# Patient Record
Sex: Male | Born: 1945
Health system: Southern US, Community
[De-identification: ages and names within clinical notes are randomized; demographics above are authoritative.]

## PROBLEM LIST (undated history)

## (undated) DIAGNOSIS — G4730 Sleep apnea, unspecified: Secondary | ICD-10-CM

## (undated) DIAGNOSIS — I219 Acute myocardial infarction, unspecified: Secondary | ICD-10-CM

## (undated) DIAGNOSIS — N390 Urinary tract infection, site not specified: Secondary | ICD-10-CM

## (undated) DIAGNOSIS — M160 Bilateral primary osteoarthritis of hip: Secondary | ICD-10-CM

## (undated) DIAGNOSIS — E785 Hyperlipidemia, unspecified: Secondary | ICD-10-CM

## (undated) DIAGNOSIS — C44319 Basal cell carcinoma of skin of other parts of face: Secondary | ICD-10-CM

## (undated) DIAGNOSIS — E119 Type 2 diabetes mellitus without complications: Secondary | ICD-10-CM

## (undated) DIAGNOSIS — M4310 Spondylolisthesis, site unspecified: Secondary | ICD-10-CM

## (undated) DIAGNOSIS — N503 Cyst of epididymis: Secondary | ICD-10-CM

## (undated) DIAGNOSIS — M1990 Unspecified osteoarthritis, unspecified site: Secondary | ICD-10-CM

## (undated) DIAGNOSIS — N433 Hydrocele, unspecified: Secondary | ICD-10-CM

## (undated) DIAGNOSIS — I10 Essential (primary) hypertension: Secondary | ICD-10-CM

## (undated) DIAGNOSIS — M5030 Other cervical disc degeneration, unspecified cervical region: Secondary | ICD-10-CM

## (undated) DIAGNOSIS — Z8489 Family history of other specified conditions: Secondary | ICD-10-CM

## (undated) DIAGNOSIS — E1169 Type 2 diabetes mellitus with other specified complication: Secondary | ICD-10-CM

## (undated) DIAGNOSIS — Z9889 Other specified postprocedural states: Secondary | ICD-10-CM

## (undated) DIAGNOSIS — R011 Cardiac murmur, unspecified: Secondary | ICD-10-CM

## (undated) DIAGNOSIS — C155 Malignant neoplasm of lower third of esophagus: Secondary | ICD-10-CM

## (undated) DIAGNOSIS — D649 Anemia, unspecified: Secondary | ICD-10-CM

## (undated) DIAGNOSIS — J90 Pleural effusion, not elsewhere classified: Secondary | ICD-10-CM

## (undated) DIAGNOSIS — M48061 Spinal stenosis, lumbar region without neurogenic claudication: Secondary | ICD-10-CM

## (undated) DIAGNOSIS — M5416 Radiculopathy, lumbar region: Secondary | ICD-10-CM

## (undated) DIAGNOSIS — H259 Unspecified age-related cataract: Secondary | ICD-10-CM

## (undated) DIAGNOSIS — C801 Malignant (primary) neoplasm, unspecified: Secondary | ICD-10-CM

## (undated) DIAGNOSIS — E559 Vitamin D deficiency, unspecified: Secondary | ICD-10-CM

## (undated) DIAGNOSIS — D500 Iron deficiency anemia secondary to blood loss (chronic): Secondary | ICD-10-CM

## (undated) DIAGNOSIS — R079 Chest pain, unspecified: Secondary | ICD-10-CM

## (undated) DIAGNOSIS — M4306 Spondylolysis, lumbar region: Secondary | ICD-10-CM

## (undated) DIAGNOSIS — R339 Retention of urine, unspecified: Secondary | ICD-10-CM

## (undated) HISTORY — DX: Vitamin D deficiency, unspecified: E55.9

## (undated) HISTORY — DX: Anemia, unspecified: D64.9

## (undated) HISTORY — DX: Spondylolisthesis, site unspecified: M43.10

## (undated) HISTORY — DX: Urinary tract infection, site not specified: N39.0

## (undated) HISTORY — PX: ANTERIOR CRUCIATE LIGAMENT REPAIR: SHX115

## (undated) HISTORY — DX: Essential (primary) hypertension: I10

## (undated) HISTORY — DX: Unspecified age-related cataract: H25.9

## (undated) HISTORY — DX: Cyst of epididymis: N50.3

## (undated) HISTORY — DX: Bilateral primary osteoarthritis of hip: M16.0

## (undated) HISTORY — DX: Spinal stenosis, lumbar region without neurogenic claudication: M48.061

## (undated) HISTORY — PX: APPENDECTOMY: SHX54

## (undated) HISTORY — DX: Other specified postprocedural states: Z98.890

## (undated) HISTORY — DX: Spondylolysis, lumbar region: M43.06

## (undated) HISTORY — DX: Pleural effusion, not elsewhere classified: J90

## (undated) HISTORY — DX: Malignant neoplasm of lower third of esophagus: C15.5

## (undated) HISTORY — DX: Other cervical disc degeneration, unspecified cervical region: M50.30

## (undated) HISTORY — DX: Hyperlipidemia, unspecified: E11.69

## (undated) HISTORY — DX: Hydrocele, unspecified: N43.3

## (undated) HISTORY — DX: Chest pain, unspecified: R07.9

## (undated) HISTORY — DX: Iron deficiency anemia secondary to blood loss (chronic): D50.0

## (undated) HISTORY — DX: Basal cell carcinoma of skin of other parts of face: C44.319

## (undated) HISTORY — DX: Retention of urine, unspecified: R33.9

## (undated) HISTORY — DX: Hyperlipidemia, unspecified: E78.5

## (undated) HISTORY — PX: HERNIA REPAIR: SHX51

## (undated) HISTORY — DX: Radiculopathy, lumbar region: M54.16

---

## 2014-04-26 DIAGNOSIS — G5602 Carpal tunnel syndrome, left upper limb: Secondary | ICD-10-CM | POA: Diagnosis not present

## 2014-04-26 DIAGNOSIS — G5601 Carpal tunnel syndrome, right upper limb: Secondary | ICD-10-CM | POA: Diagnosis not present

## 2014-04-26 DIAGNOSIS — E1140 Type 2 diabetes mellitus with diabetic neuropathy, unspecified: Secondary | ICD-10-CM | POA: Diagnosis not present

## 2014-04-26 DIAGNOSIS — G4733 Obstructive sleep apnea (adult) (pediatric): Secondary | ICD-10-CM | POA: Diagnosis not present

## 2014-04-26 DIAGNOSIS — E780 Pure hypercholesterolemia: Secondary | ICD-10-CM | POA: Diagnosis not present

## 2014-04-26 DIAGNOSIS — E1142 Type 2 diabetes mellitus with diabetic polyneuropathy: Secondary | ICD-10-CM | POA: Diagnosis not present

## 2014-04-26 DIAGNOSIS — I10 Essential (primary) hypertension: Secondary | ICD-10-CM | POA: Diagnosis not present

## 2014-04-28 DIAGNOSIS — E119 Type 2 diabetes mellitus without complications: Secondary | ICD-10-CM | POA: Diagnosis not present

## 2014-04-28 DIAGNOSIS — H04123 Dry eye syndrome of bilateral lacrimal glands: Secondary | ICD-10-CM | POA: Diagnosis not present

## 2014-05-19 DIAGNOSIS — J019 Acute sinusitis, unspecified: Secondary | ICD-10-CM | POA: Diagnosis not present

## 2014-05-23 DIAGNOSIS — E119 Type 2 diabetes mellitus without complications: Secondary | ICD-10-CM | POA: Diagnosis not present

## 2014-05-30 DIAGNOSIS — R3911 Hesitancy of micturition: Secondary | ICD-10-CM | POA: Diagnosis not present

## 2014-05-30 DIAGNOSIS — R3915 Urgency of urination: Secondary | ICD-10-CM | POA: Diagnosis not present

## 2014-06-02 DIAGNOSIS — R5383 Other fatigue: Secondary | ICD-10-CM | POA: Diagnosis not present

## 2014-06-02 DIAGNOSIS — G4733 Obstructive sleep apnea (adult) (pediatric): Secondary | ICD-10-CM | POA: Diagnosis not present

## 2014-06-02 DIAGNOSIS — J309 Allergic rhinitis, unspecified: Secondary | ICD-10-CM | POA: Diagnosis not present

## 2014-08-01 DIAGNOSIS — N400 Enlarged prostate without lower urinary tract symptoms: Secondary | ICD-10-CM | POA: Diagnosis not present

## 2014-08-01 DIAGNOSIS — G4733 Obstructive sleep apnea (adult) (pediatric): Secondary | ICD-10-CM | POA: Diagnosis not present

## 2014-08-01 DIAGNOSIS — E780 Pure hypercholesterolemia: Secondary | ICD-10-CM | POA: Diagnosis not present

## 2014-08-01 DIAGNOSIS — E119 Type 2 diabetes mellitus without complications: Secondary | ICD-10-CM | POA: Diagnosis not present

## 2014-08-01 DIAGNOSIS — E1142 Type 2 diabetes mellitus with diabetic polyneuropathy: Secondary | ICD-10-CM | POA: Diagnosis not present

## 2014-08-01 DIAGNOSIS — I10 Essential (primary) hypertension: Secondary | ICD-10-CM | POA: Diagnosis not present

## 2014-08-01 DIAGNOSIS — G5601 Carpal tunnel syndrome, right upper limb: Secondary | ICD-10-CM | POA: Diagnosis not present

## 2014-08-01 DIAGNOSIS — G5602 Carpal tunnel syndrome, left upper limb: Secondary | ICD-10-CM | POA: Diagnosis not present

## 2014-09-06 DIAGNOSIS — G4733 Obstructive sleep apnea (adult) (pediatric): Secondary | ICD-10-CM | POA: Diagnosis not present

## 2014-10-06 DIAGNOSIS — G4733 Obstructive sleep apnea (adult) (pediatric): Secondary | ICD-10-CM | POA: Diagnosis not present

## 2014-10-09 DIAGNOSIS — G4733 Obstructive sleep apnea (adult) (pediatric): Secondary | ICD-10-CM | POA: Diagnosis not present

## 2014-10-20 DIAGNOSIS — G4733 Obstructive sleep apnea (adult) (pediatric): Secondary | ICD-10-CM | POA: Diagnosis not present

## 2014-11-02 DIAGNOSIS — G4733 Obstructive sleep apnea (adult) (pediatric): Secondary | ICD-10-CM | POA: Diagnosis not present

## 2014-11-02 DIAGNOSIS — Z23 Encounter for immunization: Secondary | ICD-10-CM | POA: Diagnosis not present

## 2014-11-02 DIAGNOSIS — E1142 Type 2 diabetes mellitus with diabetic polyneuropathy: Secondary | ICD-10-CM | POA: Diagnosis not present

## 2014-11-02 DIAGNOSIS — N400 Enlarged prostate without lower urinary tract symptoms: Secondary | ICD-10-CM | POA: Diagnosis not present

## 2014-11-02 DIAGNOSIS — G5601 Carpal tunnel syndrome, right upper limb: Secondary | ICD-10-CM | POA: Diagnosis not present

## 2014-11-02 DIAGNOSIS — G5602 Carpal tunnel syndrome, left upper limb: Secondary | ICD-10-CM | POA: Diagnosis not present

## 2014-11-02 DIAGNOSIS — I10 Essential (primary) hypertension: Secondary | ICD-10-CM | POA: Diagnosis not present

## 2014-11-02 DIAGNOSIS — E780 Pure hypercholesterolemia: Secondary | ICD-10-CM | POA: Diagnosis not present

## 2014-11-06 DIAGNOSIS — G4733 Obstructive sleep apnea (adult) (pediatric): Secondary | ICD-10-CM | POA: Diagnosis not present

## 2014-11-10 DIAGNOSIS — G4733 Obstructive sleep apnea (adult) (pediatric): Secondary | ICD-10-CM | POA: Diagnosis not present

## 2014-11-10 DIAGNOSIS — R5383 Other fatigue: Secondary | ICD-10-CM | POA: Diagnosis not present

## 2014-11-10 DIAGNOSIS — J309 Allergic rhinitis, unspecified: Secondary | ICD-10-CM | POA: Diagnosis not present

## 2014-12-07 DIAGNOSIS — G4733 Obstructive sleep apnea (adult) (pediatric): Secondary | ICD-10-CM | POA: Diagnosis not present

## 2014-12-20 DIAGNOSIS — L570 Actinic keratosis: Secondary | ICD-10-CM | POA: Diagnosis not present

## 2014-12-20 DIAGNOSIS — C44319 Basal cell carcinoma of skin of other parts of face: Secondary | ICD-10-CM | POA: Diagnosis not present

## 2014-12-26 DIAGNOSIS — G4733 Obstructive sleep apnea (adult) (pediatric): Secondary | ICD-10-CM | POA: Diagnosis not present

## 2015-01-06 DIAGNOSIS — G4733 Obstructive sleep apnea (adult) (pediatric): Secondary | ICD-10-CM | POA: Diagnosis not present

## 2015-01-17 DIAGNOSIS — C44319 Basal cell carcinoma of skin of other parts of face: Secondary | ICD-10-CM | POA: Diagnosis not present

## 2015-02-06 DIAGNOSIS — G4733 Obstructive sleep apnea (adult) (pediatric): Secondary | ICD-10-CM | POA: Diagnosis not present

## 2015-02-07 DIAGNOSIS — E782 Mixed hyperlipidemia: Secondary | ICD-10-CM | POA: Diagnosis not present

## 2015-02-07 DIAGNOSIS — G5601 Carpal tunnel syndrome, right upper limb: Secondary | ICD-10-CM | POA: Diagnosis not present

## 2015-02-07 DIAGNOSIS — I10 Essential (primary) hypertension: Secondary | ICD-10-CM | POA: Diagnosis not present

## 2015-02-07 DIAGNOSIS — E1142 Type 2 diabetes mellitus with diabetic polyneuropathy: Secondary | ICD-10-CM | POA: Diagnosis not present

## 2015-02-07 DIAGNOSIS — G4733 Obstructive sleep apnea (adult) (pediatric): Secondary | ICD-10-CM | POA: Diagnosis not present

## 2015-02-07 DIAGNOSIS — N400 Enlarged prostate without lower urinary tract symptoms: Secondary | ICD-10-CM | POA: Diagnosis not present

## 2015-02-07 DIAGNOSIS — I498 Other specified cardiac arrhythmias: Secondary | ICD-10-CM | POA: Diagnosis not present

## 2015-02-07 DIAGNOSIS — E781 Pure hyperglyceridemia: Secondary | ICD-10-CM | POA: Diagnosis not present

## 2015-02-07 DIAGNOSIS — G5602 Carpal tunnel syndrome, left upper limb: Secondary | ICD-10-CM | POA: Diagnosis not present

## 2015-02-07 DIAGNOSIS — E119 Type 2 diabetes mellitus without complications: Secondary | ICD-10-CM | POA: Diagnosis not present

## 2015-02-16 DIAGNOSIS — J309 Allergic rhinitis, unspecified: Secondary | ICD-10-CM | POA: Diagnosis not present

## 2015-02-16 DIAGNOSIS — G4733 Obstructive sleep apnea (adult) (pediatric): Secondary | ICD-10-CM | POA: Diagnosis not present

## 2015-02-16 DIAGNOSIS — R5383 Other fatigue: Secondary | ICD-10-CM | POA: Diagnosis not present

## 2015-02-21 DIAGNOSIS — I10 Essential (primary) hypertension: Secondary | ICD-10-CM | POA: Diagnosis not present

## 2015-03-08 DIAGNOSIS — G4733 Obstructive sleep apnea (adult) (pediatric): Secondary | ICD-10-CM | POA: Diagnosis not present

## 2015-03-15 DIAGNOSIS — G4733 Obstructive sleep apnea (adult) (pediatric): Secondary | ICD-10-CM | POA: Diagnosis not present

## 2015-03-21 DIAGNOSIS — L570 Actinic keratosis: Secondary | ICD-10-CM | POA: Diagnosis not present

## 2015-03-22 DIAGNOSIS — J301 Allergic rhinitis due to pollen: Secondary | ICD-10-CM | POA: Diagnosis not present

## 2015-03-22 DIAGNOSIS — G4733 Obstructive sleep apnea (adult) (pediatric): Secondary | ICD-10-CM | POA: Diagnosis not present

## 2015-03-22 DIAGNOSIS — R5383 Other fatigue: Secondary | ICD-10-CM | POA: Diagnosis not present

## 2015-03-30 DIAGNOSIS — G4733 Obstructive sleep apnea (adult) (pediatric): Secondary | ICD-10-CM | POA: Diagnosis not present

## 2015-04-08 DIAGNOSIS — G4733 Obstructive sleep apnea (adult) (pediatric): Secondary | ICD-10-CM | POA: Diagnosis not present

## 2015-05-09 DIAGNOSIS — G4733 Obstructive sleep apnea (adult) (pediatric): Secondary | ICD-10-CM | POA: Diagnosis not present

## 2015-05-15 DIAGNOSIS — G5601 Carpal tunnel syndrome, right upper limb: Secondary | ICD-10-CM | POA: Diagnosis not present

## 2015-05-15 DIAGNOSIS — G4733 Obstructive sleep apnea (adult) (pediatric): Secondary | ICD-10-CM | POA: Diagnosis not present

## 2015-05-15 DIAGNOSIS — N400 Enlarged prostate without lower urinary tract symptoms: Secondary | ICD-10-CM | POA: Diagnosis not present

## 2015-05-15 DIAGNOSIS — I10 Essential (primary) hypertension: Secondary | ICD-10-CM | POA: Diagnosis not present

## 2015-05-15 DIAGNOSIS — E119 Type 2 diabetes mellitus without complications: Secondary | ICD-10-CM | POA: Diagnosis not present

## 2015-05-15 DIAGNOSIS — E1142 Type 2 diabetes mellitus with diabetic polyneuropathy: Secondary | ICD-10-CM | POA: Diagnosis not present

## 2015-05-15 DIAGNOSIS — E782 Mixed hyperlipidemia: Secondary | ICD-10-CM | POA: Diagnosis not present

## 2015-05-15 DIAGNOSIS — Z23 Encounter for immunization: Secondary | ICD-10-CM | POA: Diagnosis not present

## 2015-05-15 DIAGNOSIS — G5602 Carpal tunnel syndrome, left upper limb: Secondary | ICD-10-CM | POA: Diagnosis not present

## 2015-05-30 DIAGNOSIS — L570 Actinic keratosis: Secondary | ICD-10-CM | POA: Diagnosis not present

## 2015-06-06 DIAGNOSIS — G4733 Obstructive sleep apnea (adult) (pediatric): Secondary | ICD-10-CM | POA: Diagnosis not present

## 2015-07-07 DIAGNOSIS — G4733 Obstructive sleep apnea (adult) (pediatric): Secondary | ICD-10-CM | POA: Diagnosis not present

## 2015-07-09 DIAGNOSIS — G4733 Obstructive sleep apnea (adult) (pediatric): Secondary | ICD-10-CM | POA: Diagnosis not present

## 2015-07-13 DIAGNOSIS — H524 Presbyopia: Secondary | ICD-10-CM | POA: Diagnosis not present

## 2015-07-13 DIAGNOSIS — H5210 Myopia, unspecified eye: Secondary | ICD-10-CM | POA: Diagnosis not present

## 2015-07-19 DIAGNOSIS — Z6831 Body mass index (BMI) 31.0-31.9, adult: Secondary | ICD-10-CM | POA: Diagnosis not present

## 2015-07-19 DIAGNOSIS — M160 Bilateral primary osteoarthritis of hip: Secondary | ICD-10-CM | POA: Diagnosis not present

## 2015-07-19 DIAGNOSIS — Z1389 Encounter for screening for other disorder: Secondary | ICD-10-CM | POA: Diagnosis not present

## 2015-07-19 DIAGNOSIS — I10 Essential (primary) hypertension: Secondary | ICD-10-CM | POA: Diagnosis not present

## 2015-07-19 DIAGNOSIS — Z139 Encounter for screening, unspecified: Secondary | ICD-10-CM | POA: Diagnosis not present

## 2015-07-19 DIAGNOSIS — M5136 Other intervertebral disc degeneration, lumbar region: Secondary | ICD-10-CM | POA: Diagnosis not present

## 2015-07-19 DIAGNOSIS — M4306 Spondylolysis, lumbar region: Secondary | ICD-10-CM | POA: Diagnosis not present

## 2015-07-19 DIAGNOSIS — M25552 Pain in left hip: Secondary | ICD-10-CM | POA: Diagnosis not present

## 2015-07-19 DIAGNOSIS — Z9181 History of falling: Secondary | ICD-10-CM | POA: Diagnosis not present

## 2015-07-19 DIAGNOSIS — M5416 Radiculopathy, lumbar region: Secondary | ICD-10-CM | POA: Diagnosis not present

## 2015-08-06 DIAGNOSIS — G4733 Obstructive sleep apnea (adult) (pediatric): Secondary | ICD-10-CM | POA: Diagnosis not present

## 2015-09-06 DIAGNOSIS — G4733 Obstructive sleep apnea (adult) (pediatric): Secondary | ICD-10-CM | POA: Diagnosis not present

## 2015-09-18 DIAGNOSIS — M25562 Pain in left knee: Secondary | ICD-10-CM | POA: Diagnosis not present

## 2015-09-18 DIAGNOSIS — R262 Difficulty in walking, not elsewhere classified: Secondary | ICD-10-CM | POA: Diagnosis not present

## 2015-09-18 DIAGNOSIS — M545 Low back pain: Secondary | ICD-10-CM | POA: Diagnosis not present

## 2015-09-21 DIAGNOSIS — R262 Difficulty in walking, not elsewhere classified: Secondary | ICD-10-CM | POA: Diagnosis not present

## 2015-09-21 DIAGNOSIS — M25562 Pain in left knee: Secondary | ICD-10-CM | POA: Diagnosis not present

## 2015-09-21 DIAGNOSIS — M545 Low back pain: Secondary | ICD-10-CM | POA: Diagnosis not present

## 2015-09-24 DIAGNOSIS — M545 Low back pain: Secondary | ICD-10-CM | POA: Diagnosis not present

## 2015-09-24 DIAGNOSIS — R262 Difficulty in walking, not elsewhere classified: Secondary | ICD-10-CM | POA: Diagnosis not present

## 2015-09-24 DIAGNOSIS — M25562 Pain in left knee: Secondary | ICD-10-CM | POA: Diagnosis not present

## 2015-09-28 DIAGNOSIS — M545 Low back pain: Secondary | ICD-10-CM | POA: Diagnosis not present

## 2015-09-28 DIAGNOSIS — M25562 Pain in left knee: Secondary | ICD-10-CM | POA: Diagnosis not present

## 2015-09-28 DIAGNOSIS — R262 Difficulty in walking, not elsewhere classified: Secondary | ICD-10-CM | POA: Diagnosis not present

## 2015-10-03 DIAGNOSIS — R262 Difficulty in walking, not elsewhere classified: Secondary | ICD-10-CM | POA: Diagnosis not present

## 2015-10-03 DIAGNOSIS — M25562 Pain in left knee: Secondary | ICD-10-CM | POA: Diagnosis not present

## 2015-10-03 DIAGNOSIS — M545 Low back pain: Secondary | ICD-10-CM | POA: Diagnosis not present

## 2015-10-10 DIAGNOSIS — M545 Low back pain: Secondary | ICD-10-CM | POA: Diagnosis not present

## 2015-10-10 DIAGNOSIS — R262 Difficulty in walking, not elsewhere classified: Secondary | ICD-10-CM | POA: Diagnosis not present

## 2015-10-10 DIAGNOSIS — M25562 Pain in left knee: Secondary | ICD-10-CM | POA: Diagnosis not present

## 2015-10-10 DIAGNOSIS — G4733 Obstructive sleep apnea (adult) (pediatric): Secondary | ICD-10-CM | POA: Diagnosis not present

## 2015-10-12 DIAGNOSIS — M545 Low back pain: Secondary | ICD-10-CM | POA: Diagnosis not present

## 2015-10-12 DIAGNOSIS — M25562 Pain in left knee: Secondary | ICD-10-CM | POA: Diagnosis not present

## 2015-10-12 DIAGNOSIS — R262 Difficulty in walking, not elsewhere classified: Secondary | ICD-10-CM | POA: Diagnosis not present

## 2015-10-23 DIAGNOSIS — M25562 Pain in left knee: Secondary | ICD-10-CM | POA: Diagnosis not present

## 2015-10-23 DIAGNOSIS — M545 Low back pain: Secondary | ICD-10-CM | POA: Diagnosis not present

## 2015-10-23 DIAGNOSIS — R262 Difficulty in walking, not elsewhere classified: Secondary | ICD-10-CM | POA: Diagnosis not present

## 2015-10-26 DIAGNOSIS — R262 Difficulty in walking, not elsewhere classified: Secondary | ICD-10-CM | POA: Diagnosis not present

## 2015-10-26 DIAGNOSIS — M25562 Pain in left knee: Secondary | ICD-10-CM | POA: Diagnosis not present

## 2015-10-26 DIAGNOSIS — M545 Low back pain: Secondary | ICD-10-CM | POA: Diagnosis not present

## 2015-10-31 DIAGNOSIS — M545 Low back pain: Secondary | ICD-10-CM | POA: Diagnosis not present

## 2015-10-31 DIAGNOSIS — R262 Difficulty in walking, not elsewhere classified: Secondary | ICD-10-CM | POA: Diagnosis not present

## 2015-10-31 DIAGNOSIS — M25562 Pain in left knee: Secondary | ICD-10-CM | POA: Diagnosis not present

## 2015-11-07 DIAGNOSIS — M545 Low back pain: Secondary | ICD-10-CM | POA: Diagnosis not present

## 2015-11-07 DIAGNOSIS — M25562 Pain in left knee: Secondary | ICD-10-CM | POA: Diagnosis not present

## 2015-11-07 DIAGNOSIS — R262 Difficulty in walking, not elsewhere classified: Secondary | ICD-10-CM | POA: Diagnosis not present

## 2015-11-15 DIAGNOSIS — R262 Difficulty in walking, not elsewhere classified: Secondary | ICD-10-CM | POA: Diagnosis not present

## 2015-11-15 DIAGNOSIS — M25562 Pain in left knee: Secondary | ICD-10-CM | POA: Diagnosis not present

## 2015-11-15 DIAGNOSIS — M545 Low back pain: Secondary | ICD-10-CM | POA: Diagnosis not present

## 2015-11-22 DIAGNOSIS — L570 Actinic keratosis: Secondary | ICD-10-CM | POA: Diagnosis not present

## 2016-01-18 DIAGNOSIS — G4733 Obstructive sleep apnea (adult) (pediatric): Secondary | ICD-10-CM | POA: Diagnosis not present

## 2016-01-24 DIAGNOSIS — I10 Essential (primary) hypertension: Secondary | ICD-10-CM | POA: Diagnosis not present

## 2016-01-24 DIAGNOSIS — Z0000 Encounter for general adult medical examination without abnormal findings: Secondary | ICD-10-CM | POA: Diagnosis not present

## 2016-01-24 DIAGNOSIS — E669 Obesity, unspecified: Secondary | ICD-10-CM | POA: Diagnosis not present

## 2016-01-24 DIAGNOSIS — E785 Hyperlipidemia, unspecified: Secondary | ICD-10-CM | POA: Diagnosis not present

## 2016-01-24 DIAGNOSIS — Z6831 Body mass index (BMI) 31.0-31.9, adult: Secondary | ICD-10-CM | POA: Diagnosis not present

## 2016-01-24 DIAGNOSIS — M4317 Spondylolisthesis, lumbosacral region: Secondary | ICD-10-CM | POA: Diagnosis not present

## 2016-01-24 DIAGNOSIS — E1165 Type 2 diabetes mellitus with hyperglycemia: Secondary | ICD-10-CM | POA: Diagnosis not present

## 2016-01-24 DIAGNOSIS — L989 Disorder of the skin and subcutaneous tissue, unspecified: Secondary | ICD-10-CM | POA: Diagnosis not present

## 2016-03-13 DIAGNOSIS — S61412A Laceration without foreign body of left hand, initial encounter: Secondary | ICD-10-CM | POA: Diagnosis not present

## 2016-03-19 DIAGNOSIS — C44319 Basal cell carcinoma of skin of other parts of face: Secondary | ICD-10-CM | POA: Diagnosis not present

## 2016-03-19 DIAGNOSIS — N509 Disorder of male genital organs, unspecified: Secondary | ICD-10-CM | POA: Diagnosis not present

## 2016-03-19 DIAGNOSIS — I861 Scrotal varices: Secondary | ICD-10-CM | POA: Diagnosis not present

## 2016-03-19 DIAGNOSIS — Z6832 Body mass index (BMI) 32.0-32.9, adult: Secondary | ICD-10-CM | POA: Diagnosis not present

## 2016-04-04 DIAGNOSIS — L918 Other hypertrophic disorders of the skin: Secondary | ICD-10-CM | POA: Diagnosis not present

## 2016-04-04 DIAGNOSIS — L821 Other seborrheic keratosis: Secondary | ICD-10-CM | POA: Diagnosis not present

## 2016-04-04 DIAGNOSIS — L578 Other skin changes due to chronic exposure to nonionizing radiation: Secondary | ICD-10-CM | POA: Diagnosis not present

## 2016-04-04 DIAGNOSIS — L570 Actinic keratosis: Secondary | ICD-10-CM | POA: Diagnosis not present

## 2016-04-09 DIAGNOSIS — N50819 Testicular pain, unspecified: Secondary | ICD-10-CM | POA: Diagnosis not present

## 2016-04-09 DIAGNOSIS — R339 Retention of urine, unspecified: Secondary | ICD-10-CM | POA: Diagnosis not present

## 2016-04-09 DIAGNOSIS — N401 Enlarged prostate with lower urinary tract symptoms: Secondary | ICD-10-CM | POA: Diagnosis not present

## 2016-04-29 DIAGNOSIS — E785 Hyperlipidemia, unspecified: Secondary | ICD-10-CM | POA: Diagnosis not present

## 2016-04-29 DIAGNOSIS — G4733 Obstructive sleep apnea (adult) (pediatric): Secondary | ICD-10-CM | POA: Diagnosis not present

## 2016-04-29 DIAGNOSIS — E1165 Type 2 diabetes mellitus with hyperglycemia: Secondary | ICD-10-CM | POA: Diagnosis not present

## 2016-04-29 DIAGNOSIS — I10 Essential (primary) hypertension: Secondary | ICD-10-CM | POA: Diagnosis not present

## 2016-05-07 DIAGNOSIS — R81 Glycosuria: Secondary | ICD-10-CM | POA: Diagnosis not present

## 2016-05-07 DIAGNOSIS — N401 Enlarged prostate with lower urinary tract symptoms: Secondary | ICD-10-CM | POA: Diagnosis not present

## 2016-05-07 DIAGNOSIS — N50819 Testicular pain, unspecified: Secondary | ICD-10-CM | POA: Diagnosis not present

## 2016-05-27 DIAGNOSIS — I10 Essential (primary) hypertension: Secondary | ICD-10-CM | POA: Diagnosis not present

## 2016-07-24 DIAGNOSIS — H524 Presbyopia: Secondary | ICD-10-CM | POA: Diagnosis not present

## 2016-08-05 DIAGNOSIS — G4733 Obstructive sleep apnea (adult) (pediatric): Secondary | ICD-10-CM | POA: Diagnosis not present

## 2016-08-15 DIAGNOSIS — J342 Deviated nasal septum: Secondary | ICD-10-CM | POA: Diagnosis not present

## 2016-08-15 DIAGNOSIS — H9193 Unspecified hearing loss, bilateral: Secondary | ICD-10-CM | POA: Diagnosis not present

## 2016-08-15 DIAGNOSIS — Z570 Occupational exposure to noise: Secondary | ICD-10-CM | POA: Diagnosis not present

## 2016-08-15 DIAGNOSIS — H9313 Tinnitus, bilateral: Secondary | ICD-10-CM | POA: Diagnosis not present

## 2016-08-15 DIAGNOSIS — H903 Sensorineural hearing loss, bilateral: Secondary | ICD-10-CM | POA: Diagnosis not present

## 2016-08-27 DIAGNOSIS — I10 Essential (primary) hypertension: Secondary | ICD-10-CM | POA: Diagnosis not present

## 2016-08-27 DIAGNOSIS — E785 Hyperlipidemia, unspecified: Secondary | ICD-10-CM | POA: Diagnosis not present

## 2016-08-27 DIAGNOSIS — Z1389 Encounter for screening for other disorder: Secondary | ICD-10-CM | POA: Diagnosis not present

## 2016-08-27 DIAGNOSIS — Z9181 History of falling: Secondary | ICD-10-CM | POA: Diagnosis not present

## 2016-08-27 DIAGNOSIS — E1165 Type 2 diabetes mellitus with hyperglycemia: Secondary | ICD-10-CM | POA: Diagnosis not present

## 2016-08-27 DIAGNOSIS — Z6832 Body mass index (BMI) 32.0-32.9, adult: Secondary | ICD-10-CM | POA: Diagnosis not present

## 2016-09-11 DIAGNOSIS — N401 Enlarged prostate with lower urinary tract symptoms: Secondary | ICD-10-CM | POA: Diagnosis not present

## 2016-09-11 DIAGNOSIS — N50819 Testicular pain, unspecified: Secondary | ICD-10-CM | POA: Diagnosis not present

## 2016-09-11 DIAGNOSIS — R339 Retention of urine, unspecified: Secondary | ICD-10-CM | POA: Diagnosis not present

## 2016-09-11 DIAGNOSIS — N433 Hydrocele, unspecified: Secondary | ICD-10-CM | POA: Diagnosis not present

## 2016-09-11 DIAGNOSIS — N503 Cyst of epididymis: Secondary | ICD-10-CM | POA: Diagnosis not present

## 2016-09-26 DIAGNOSIS — M5137 Other intervertebral disc degeneration, lumbosacral region: Secondary | ICD-10-CM | POA: Diagnosis not present

## 2016-09-26 DIAGNOSIS — M4317 Spondylolisthesis, lumbosacral region: Secondary | ICD-10-CM | POA: Diagnosis not present

## 2016-11-03 DIAGNOSIS — G4733 Obstructive sleep apnea (adult) (pediatric): Secondary | ICD-10-CM | POA: Diagnosis not present

## 2016-11-04 DIAGNOSIS — M48061 Spinal stenosis, lumbar region without neurogenic claudication: Secondary | ICD-10-CM | POA: Diagnosis not present

## 2016-11-04 DIAGNOSIS — M5416 Radiculopathy, lumbar region: Secondary | ICD-10-CM | POA: Diagnosis not present

## 2016-11-18 DIAGNOSIS — L570 Actinic keratosis: Secondary | ICD-10-CM | POA: Diagnosis not present

## 2016-11-20 ENCOUNTER — Other Ambulatory Visit: Payer: Self-pay | Admitting: Neurological Surgery

## 2016-11-20 DIAGNOSIS — M5127 Other intervertebral disc displacement, lumbosacral region: Secondary | ICD-10-CM | POA: Diagnosis not present

## 2016-11-20 DIAGNOSIS — M4727 Other spondylosis with radiculopathy, lumbosacral region: Secondary | ICD-10-CM | POA: Diagnosis not present

## 2016-11-20 DIAGNOSIS — M5416 Radiculopathy, lumbar region: Secondary | ICD-10-CM | POA: Diagnosis not present

## 2016-11-20 DIAGNOSIS — M4317 Spondylolisthesis, lumbosacral region: Secondary | ICD-10-CM | POA: Diagnosis not present

## 2016-11-27 DIAGNOSIS — H52209 Unspecified astigmatism, unspecified eye: Secondary | ICD-10-CM | POA: Diagnosis not present

## 2016-11-27 DIAGNOSIS — H524 Presbyopia: Secondary | ICD-10-CM | POA: Diagnosis not present

## 2016-11-27 DIAGNOSIS — H5203 Hypermetropia, bilateral: Secondary | ICD-10-CM | POA: Diagnosis not present

## 2016-12-02 DIAGNOSIS — M48061 Spinal stenosis, lumbar region without neurogenic claudication: Secondary | ICD-10-CM | POA: Diagnosis not present

## 2016-12-02 DIAGNOSIS — E1165 Type 2 diabetes mellitus with hyperglycemia: Secondary | ICD-10-CM | POA: Diagnosis not present

## 2016-12-02 DIAGNOSIS — I10 Essential (primary) hypertension: Secondary | ICD-10-CM | POA: Diagnosis not present

## 2016-12-02 DIAGNOSIS — Z23 Encounter for immunization: Secondary | ICD-10-CM | POA: Diagnosis not present

## 2016-12-02 DIAGNOSIS — E785 Hyperlipidemia, unspecified: Secondary | ICD-10-CM | POA: Diagnosis not present

## 2016-12-16 ENCOUNTER — Other Ambulatory Visit (HOSPITAL_COMMUNITY): Payer: Self-pay | Admitting: Neurological Surgery

## 2016-12-16 DIAGNOSIS — M5416 Radiculopathy, lumbar region: Secondary | ICD-10-CM

## 2016-12-24 ENCOUNTER — Ambulatory Visit (HOSPITAL_COMMUNITY)
Admission: RE | Admit: 2016-12-24 | Discharge: 2016-12-24 | Disposition: A | Discharge status: Home / Self Care | Payer: Medicare HMO | Source: Ambulatory Visit | Attending: Neurological Surgery | Admitting: Neurological Surgery

## 2016-12-24 DIAGNOSIS — M5416 Radiculopathy, lumbar region: Secondary | ICD-10-CM | POA: Diagnosis not present

## 2016-12-24 DIAGNOSIS — M1288 Other specific arthropathies, not elsewhere classified, other specified site: Secondary | ICD-10-CM | POA: Insufficient documentation

## 2016-12-24 DIAGNOSIS — M48061 Spinal stenosis, lumbar region without neurogenic claudication: Secondary | ICD-10-CM | POA: Diagnosis not present

## 2016-12-24 DIAGNOSIS — M5126 Other intervertebral disc displacement, lumbar region: Secondary | ICD-10-CM | POA: Diagnosis not present

## 2016-12-24 DIAGNOSIS — I700 Atherosclerosis of aorta: Secondary | ICD-10-CM | POA: Insufficient documentation

## 2016-12-24 DIAGNOSIS — M5136 Other intervertebral disc degeneration, lumbar region: Secondary | ICD-10-CM | POA: Diagnosis not present

## 2016-12-24 DIAGNOSIS — M4316 Spondylolisthesis, lumbar region: Secondary | ICD-10-CM | POA: Diagnosis not present

## 2016-12-30 NOTE — Pre-Procedure Instructions (Signed)
[place]  12/30/2016      [facility] - [place] [address] [place], [place] [address] Phone: [phone] Fax: [phone]  [facility] [address], [place] - [place] [address] [place] [address] Phone: [phone] Fax: [phone]    Your procedure is scheduled on Monday October 8.  Report to [facility] Admitting at 5:30 A.M.  Call this number if you have problems the morning of surgery:  [phone]   Remember:  Do not eat food or drink liquids after midnight.  Take these medicines the morning of surgery with A SIP OF WATER: amlodipine (norvasc), atenolol-chlorthalidone, doxazosin (Cardura)  7 days prior to surgery STOP taking any Aspirin (unless otherwise instructed by your surgeon), Aleve, Naproxen, Ibuprofen, Motrin, Advil, Goody's, BC's, all herbal medications, fish oil, and all vitamins   WHAT DO I DO ABOUT MY DIABETES MEDICATION?  Do not take oral diabetes medicines (pills) the morning of surgery. DO NOT TAKE metformin (glucophage) or glimepiride (Amaryl) the day of surgery    How to Manage Your Diabetes Before and After Surgery  Why is it important to control my blood sugar before and after surgery? . Improving blood sugar levels before and after surgery helps healing and can limit problems. . A way of improving blood sugar control is eating a healthy diet by: o  Eating less sugar and carbohydrates o  Increasing activity/exercise o  Talking with your doctor about reaching your blood sugar goals . High blood sugars (greater than 180 mg/dL) can raise your risk of infections and slow your recovery, so you will need to focus on controlling your diabetes during the weeks before surgery. . Make sure that the doctor who takes care of your diabetes knows about your planned surgery including the date and location.  How do I manage my blood sugar before surgery? . Check  your blood sugar at least 4 times a day, starting 2 days before surgery, to make sure that the level is not too high or low. o Check your blood sugar the morning of your surgery when you wake up and every 2 hours until you get to the Short Stay unit. . If your blood sugar is less than 70 mg/dL, you will need to treat for low blood sugar: o Do not take insulin. o Treat a low blood sugar (less than 70 mg/dL) with  cup of clear juice (cranberry or apple), 4 glucose tablets, OR glucose gel. o Recheck blood sugar in 15 minutes after treatment (to make sure it is greater than 70 mg/dL). If your blood sugar is not greater than 70 mg/dL on recheck, call [phone] for further instructions. . Report your blood sugar to the short stay nurse when you get to Short Stay.  . If you are admitted to the hospital after surgery: o Your blood sugar will be checked by the staff and you will probably be given insulin after surgery (instead of oral diabetes medicines) to make sure you have good blood sugar levels. o The goal for blood sugar control after surgery is 80-180 mg/dL.               Do not wear jewelry, make-up or nail polish.  Do not wear lotions, powders, or perfumes, or deoderant.  Do not shave 48 hours prior to surgery.  Men may shave face and neck.  Do not bring valuables to the hospital.  [facility] is not responsible for any belongings or valuables.  Contacts, dentures or bridgework may not be worn into surgery.  Leave your suitcase in the car.  After surgery it may be brought to your room.  For patients admitted to the hospital, discharge time will be determined by your treatment team.  Patients discharged the day of surgery will not be allowed to drive home.   Special instructions:    [facility]- Preparing For Surgery  Before surgery, you can play an important role. Because skin is not sterile, your skin needs to be as free of germs as possible. You can reduce the number of  germs on your skin by washing with CHG (chlorahexidine gluconate) Soap before surgery.  CHG is an antiseptic cleaner which kills germs and bonds with the skin to continue killing germs even after washing.  Please do not use if you have an allergy to CHG or antibacterial soaps. If your skin becomes reddened/irritated stop using the CHG.  Do not shave (including legs and underarms) for at least 48 hours prior to first CHG shower. It is OK to shave your face.  Please follow these instructions carefully.   1. Shower the NIGHT BEFORE SURGERY and the MORNING OF SURGERY with CHG.   2. If you chose to wash your hair, wash your hair first as usual with your normal shampoo.  3. After you shampoo, rinse your hair and body thoroughly to remove the shampoo.  4. Use CHG as you would any other liquid soap. You can apply CHG directly to the skin and wash gently with a scrungie or a clean washcloth.   5. Apply the CHG Soap to your body ONLY FROM THE NECK DOWN.  Do not use on open wounds or open sores. Avoid contact with your eyes, ears, mouth and genitals (private parts). Wash genitals (private parts) with your normal soap.  USE REGULAR SHAMPOO AND CONDITIONER FOR HAIR USE REGULAR SOAP FOR FACE AND PRIVATE AREA  6. Wash thoroughly, paying special attention to the area where your surgery will be performed.  7. Thoroughly rinse your body with warm water from the neck down.  8. DO NOT shower/wash with your normal soap after using and rinsing off the CHG Soap.  9. Pat yourself dry with a CLEAN TOWEL and [place] CLOTH  10. Wear CLEAN PAJAMAS to bed the night before surgery, wear comfortable clothes the morning of surgery  11. Place CLEAN SHEETS on your bed the night of your first shower and DO NOT SLEEP WITH PETS.    Day of Surgery: Do not apply any deodorants/lotions. Please wear clean clothes to the hospital/surgery center.      Please read over the following fact sheets that you were given. MRSA  Information

## 2016-12-31 ENCOUNTER — Encounter (HOSPITAL_COMMUNITY): Payer: Self-pay

## 2016-12-31 ENCOUNTER — Encounter (HOSPITAL_COMMUNITY)
Admission: RE | Admit: 2016-12-31 | Discharge: 2016-12-31 | Disposition: A | Discharge status: Home / Self Care | Payer: Medicare HMO | Source: Ambulatory Visit | Attending: Neurological Surgery | Admitting: Neurological Surgery

## 2016-12-31 DIAGNOSIS — E119 Type 2 diabetes mellitus without complications: Secondary | ICD-10-CM | POA: Diagnosis not present

## 2016-12-31 DIAGNOSIS — Z01818 Encounter for other preprocedural examination: Secondary | ICD-10-CM | POA: Insufficient documentation

## 2016-12-31 DIAGNOSIS — I10 Essential (primary) hypertension: Secondary | ICD-10-CM | POA: Diagnosis not present

## 2016-12-31 HISTORY — DX: Family history of other specified conditions: Z84.89

## 2016-12-31 HISTORY — DX: Sleep apnea, unspecified: G47.30

## 2016-12-31 HISTORY — DX: Cardiac murmur, unspecified: R01.1

## 2016-12-31 HISTORY — DX: Malignant (primary) neoplasm, unspecified: C80.1

## 2016-12-31 HISTORY — DX: Essential (primary) hypertension: I10

## 2016-12-31 HISTORY — DX: Type 2 diabetes mellitus without complications: E11.9

## 2016-12-31 HISTORY — DX: Unspecified osteoarthritis, unspecified site: M19.90

## 2016-12-31 LAB — CBC
HCT: 36.9 % — ABNORMAL LOW (ref 39.0–52.0)
Hemoglobin: 12.5 g/dL — ABNORMAL LOW (ref 13.0–17.0)
MCH: 30.7 pg (ref 26.0–34.0)
MCHC: 33.9 g/dL (ref 30.0–36.0)
MCV: 90.7 fL (ref 78.0–100.0)
Platelets: 178 10*3/uL (ref 150–400)
RBC: 4.07 MIL/uL — ABNORMAL LOW (ref 4.22–5.81)
RDW: 13.4 % (ref 11.5–15.5)
WBC: 9.7 10*3/uL (ref 4.0–10.5)

## 2016-12-31 LAB — BASIC METABOLIC PANEL
Anion gap: 8 (ref 5–15)
BUN: 31 mg/dL — AB (ref 6–20)
CALCIUM: 9.2 mg/dL (ref 8.9–10.3)
CHLORIDE: 106 mmol/L (ref 101–111)
CO2: 26 mmol/L (ref 22–32)
CREATININE: 1.18 mg/dL (ref 0.61–1.24)
GFR calc non Af Amer: 59 mL/min — ABNORMAL LOW (ref >60)
Glucose, Bld: 111 mg/dL — ABNORMAL HIGH (ref 65–99)
Potassium: 3.5 mmol/L (ref 3.5–5.1)
SODIUM: 140 mmol/L (ref 135–145)

## 2016-12-31 LAB — SURGICAL PCR SCREEN
MRSA, PCR: NEGATIVE
STAPHYLOCOCCUS AUREUS: POSITIVE — AB

## 2016-12-31 LAB — TYPE AND SCREEN
ABO/RH(D): A POS
Antibody Screen: NEGATIVE

## 2016-12-31 LAB — GLUCOSE, CAPILLARY: GLUCOSE-CAPILLARY: 124 mg/dL — AB (ref 65–99)

## 2016-12-31 LAB — ABO/RH: ABO/RH(D): A POS

## 2016-12-31 MED ORDER — CHLORHEXIDINE GLUCONATE CLOTH 2 % EX PADS: 6.0000 | MEDICATED_PAD | Freq: Once | CUTANEOUS | Status: DC

## 2016-12-31 NOTE — Progress Notes (Signed)
PCP - Nelda Bucks  Pt states he had a stress test 28 years ago that was normal, denies cardiac history other than high blood pressure.   EKG- 12/31/2016  Sleep Study - in paper chart  Fasting Blood Sugar - 150-160, last A1c 7.2 per patient, requested from PCP  Patient denies shortness of breath, fever, cough and chest pain at PAT appointment   Patient verbalized understanding of instructions that were given to them at the PAT appointment. Patient was also instructed that they will need to review over the PAT instructions again at home before surgery.

## 2017-01-04 NOTE — Anesthesia Preprocedure Evaluation (Addendum)
Anesthesia Evaluation  Patient identified by MRN, date of birth, ID band Patient awake    Reviewed: Allergy & Precautions, H&P , Patient's Chart, lab work & pertinent test results, reviewed documented beta blocker date and time   Airway Mallampati: II  TM Distance: >3 FB Neck ROM: full    Dental no notable dental hx.    Pulmonary    Pulmonary exam normal breath sounds clear to auscultation       Cardiovascular hypertension,  Rhythm:regular Rate:Normal     Neuro/Psych    GI/Hepatic   Endo/Other  diabetes  Renal/GU      Musculoskeletal   Abdominal   Peds  Hematology   Anesthesia Other Findings   Reproductive/Obstetrics                             Anesthesia Physical Anesthesia Plan  ASA: II  Anesthesia Plan: General   Post-op Pain Management:    Induction: Intravenous  PONV Risk Score and Plan: 2 and Ondansetron, Dexamethasone and Treatment may vary due to age or medical condition  Airway Management Planned: Oral ETT  Additional Equipment: Arterial line  Intra-op Plan:   Post-operative Plan: Extubation in OR  Informed Consent: I have reviewed the patients History and Physical, chart, labs and discussed the procedure including the risks, benefits and alternatives for the proposed anesthesia with the patient or authorized representative who has indicated his/her understanding and acceptance.   Dental Advisory Given  Plan Discussed with: CRNA and Surgeon  Anesthesia Plan Comments: (Nasal trumpet after induction ( OSA ) 2 lV's A-line  Consider glucomander: will check BS in am)      Anesthesia Quick Evaluation

## 2017-01-05 ENCOUNTER — Inpatient Hospital Stay (HOSPITAL_COMMUNITY)
Admission: RE | Admit: 2017-01-05 | Discharge: 2017-01-07 | DRG: 455 | Disposition: A | Discharge status: Home / Self Care | Payer: Medicare HMO | Source: Ambulatory Visit | Attending: Neurological Surgery | Admitting: Neurological Surgery

## 2017-01-05 ENCOUNTER — Inpatient Hospital Stay (HOSPITAL_COMMUNITY): Payer: Medicare HMO

## 2017-01-05 ENCOUNTER — Inpatient Hospital Stay (HOSPITAL_COMMUNITY): Payer: Medicare HMO | Admitting: Anesthesiology

## 2017-01-05 ENCOUNTER — Encounter (HOSPITAL_COMMUNITY): Payer: Self-pay

## 2017-01-05 ENCOUNTER — Inpatient Hospital Stay (HOSPITAL_COMMUNITY)
Admission: RE | Disposition: A | Discharge status: Home / Self Care | Payer: Self-pay | Source: Ambulatory Visit | Attending: Neurological Surgery

## 2017-01-05 ENCOUNTER — Inpatient Hospital Stay (HOSPITAL_COMMUNITY): Payer: Medicare HMO | Admitting: Emergency Medicine

## 2017-01-05 DIAGNOSIS — I10 Essential (primary) hypertension: Secondary | ICD-10-CM | POA: Diagnosis present

## 2017-01-05 DIAGNOSIS — Z419 Encounter for procedure for purposes other than remedying health state, unspecified: Secondary | ICD-10-CM

## 2017-01-05 DIAGNOSIS — M4807 Spinal stenosis, lumbosacral region: Secondary | ICD-10-CM | POA: Diagnosis not present

## 2017-01-05 DIAGNOSIS — M4316 Spondylolisthesis, lumbar region: Secondary | ICD-10-CM | POA: Diagnosis present

## 2017-01-05 DIAGNOSIS — G4730 Sleep apnea, unspecified: Secondary | ICD-10-CM | POA: Diagnosis present

## 2017-01-05 DIAGNOSIS — E119 Type 2 diabetes mellitus without complications: Secondary | ICD-10-CM | POA: Diagnosis present

## 2017-01-05 DIAGNOSIS — R339 Retention of urine, unspecified: Secondary | ICD-10-CM | POA: Diagnosis not present

## 2017-01-05 DIAGNOSIS — M4727 Other spondylosis with radiculopathy, lumbosacral region: Secondary | ICD-10-CM | POA: Diagnosis not present

## 2017-01-05 DIAGNOSIS — M4317 Spondylolisthesis, lumbosacral region: Secondary | ICD-10-CM | POA: Diagnosis not present

## 2017-01-05 DIAGNOSIS — M48061 Spinal stenosis, lumbar region without neurogenic claudication: Secondary | ICD-10-CM | POA: Diagnosis not present

## 2017-01-05 DIAGNOSIS — M4726 Other spondylosis with radiculopathy, lumbar region: Secondary | ICD-10-CM | POA: Diagnosis not present

## 2017-01-05 DIAGNOSIS — Z7984 Long term (current) use of oral hypoglycemic drugs: Secondary | ICD-10-CM

## 2017-01-05 DIAGNOSIS — M545 Low back pain: Secondary | ICD-10-CM | POA: Diagnosis present

## 2017-01-05 DIAGNOSIS — Z85828 Personal history of other malignant neoplasm of skin: Secondary | ICD-10-CM | POA: Diagnosis not present

## 2017-01-05 DIAGNOSIS — M4326 Fusion of spine, lumbar region: Secondary | ICD-10-CM | POA: Diagnosis not present

## 2017-01-05 DIAGNOSIS — Z7982 Long term (current) use of aspirin: Secondary | ICD-10-CM

## 2017-01-05 HISTORY — PX: ANTERIOR LAT LUMBAR FUSION: SHX1168

## 2017-01-05 HISTORY — PX: APPLICATION OF ROBOTIC ASSISTANCE FOR SPINAL PROCEDURE: SHX6753

## 2017-01-05 LAB — POCT I-STAT 4, (NA,K, GLUC, HGB,HCT)
Glucose, Bld: 147 mg/dL — ABNORMAL HIGH (ref 65–99)
HCT: 31 % — ABNORMAL LOW (ref 39.0–52.0)
Hemoglobin: 10.5 g/dL — ABNORMAL LOW (ref 13.0–17.0)
Potassium: 3.4 mmol/L — ABNORMAL LOW (ref 3.5–5.1)
Sodium: 141 mmol/L (ref 135–145)

## 2017-01-05 LAB — GLUCOSE, CAPILLARY
GLUCOSE-CAPILLARY: 127 mg/dL — AB (ref 65–99)
Glucose-Capillary: 114 mg/dL — ABNORMAL HIGH (ref 65–99)
Glucose-Capillary: 232 mg/dL — ABNORMAL HIGH (ref 65–99)
Glucose-Capillary: 276 mg/dL — ABNORMAL HIGH (ref 65–99)

## 2017-01-05 SURGERY — ANTERIOR LATERAL LUMBAR FUSION 2 LEVELS
Anesthesia: General | Site: Back | Laterality: Right

## 2017-01-05 MED ORDER — ALBUMIN HUMAN 5 % IV SOLN
INTRAVENOUS | Status: DC | PRN
Start: 2017-01-05 — End: 2017-01-05
  Administered 2017-01-05: 12:00:00 via INTRAVENOUS

## 2017-01-05 MED ORDER — MIDAZOLAM HCL 2 MG/2ML IJ SOLN
INTRAMUSCULAR | Status: AC
  Filled 2017-01-05: qty 2

## 2017-01-05 MED ORDER — FENTANYL CITRATE (PF) 100 MCG/2ML IJ SOLN
INTRAMUSCULAR | Status: DC | PRN
  Administered 2017-01-05: 150 ug via INTRAVENOUS
  Administered 2017-01-05 (×5): 50 ug via INTRAVENOUS

## 2017-01-05 MED ORDER — PROPOFOL 10 MG/ML IV BOLUS
INTRAVENOUS | Status: DC | PRN
  Administered 2017-01-05: 120 mg via INTRAVENOUS

## 2017-01-05 MED ORDER — ATENOLOL-CHLORTHALIDONE 50-25 MG PO TABS: 0.5000 | ORAL_TABLET | Freq: Every day | ORAL | Status: DC

## 2017-01-05 MED ORDER — BUPIVACAINE HCL (PF) 0.5 % IJ SOLN
INTRAMUSCULAR | Status: DC | PRN
  Administered 2017-01-05: 4 mL

## 2017-01-05 MED ORDER — CEFAZOLIN SODIUM-DEXTROSE 2-4 GM/100ML-% IV SOLN
INTRAVENOUS | Status: AC
  Filled 2017-01-05: qty 100

## 2017-01-05 MED ORDER — PROPOFOL 500 MG/50ML IV EMUL
INTRAVENOUS | Status: DC | PRN
  Administered 2017-01-05: 75 ug/kg/min via INTRAVENOUS

## 2017-01-05 MED ORDER — SODIUM CHLORIDE 0.9% FLUSH: 3.0000 mL | Freq: Two times a day (BID) | INTRAVENOUS | Status: DC

## 2017-01-05 MED ORDER — AMLODIPINE BESYLATE 10 MG PO TABS
10.0000 mg | ORAL_TABLET | Freq: Every day | ORAL | Status: DC
  Administered 2017-01-05 – 2017-01-07 (×2): 10 mg via ORAL
  Filled 2017-01-05 (×3): qty 1

## 2017-01-05 MED ORDER — ONDANSETRON HCL 4 MG PO TABS
4.0000 mg | ORAL_TABLET | Freq: Four times a day (QID) | ORAL | Status: DC | PRN
  Administered 2017-01-05 – 2017-01-06 (×2): 4 mg via ORAL
  Filled 2017-01-05 (×2): qty 1

## 2017-01-05 MED ORDER — EPHEDRINE SULFATE 50 MG/ML IJ SOLN
INTRAMUSCULAR | Status: DC | PRN
  Administered 2017-01-05: 10 mg via INTRAVENOUS

## 2017-01-05 MED ORDER — EPHEDRINE 5 MG/ML INJ
INTRAVENOUS | Status: AC
  Filled 2017-01-05: qty 10

## 2017-01-05 MED ORDER — BISACODYL 5 MG PO TBEC: 5.0000 mg | DELAYED_RELEASE_TABLET | Freq: Every day | ORAL | Status: DC | PRN

## 2017-01-05 MED ORDER — PHENOL 1.4 % MT LIQD: 1.0000 | OROMUCOSAL | Status: DC | PRN

## 2017-01-05 MED ORDER — ONDANSETRON HCL 4 MG/2ML IJ SOLN
INTRAMUSCULAR | Status: DC | PRN
  Administered 2017-01-05: 4 mg via INTRAVENOUS

## 2017-01-05 MED ORDER — SUCCINYLCHOLINE CHLORIDE 20 MG/ML IJ SOLN
INTRAMUSCULAR | Status: DC | PRN
  Administered 2017-01-05: 140 mg via INTRAVENOUS

## 2017-01-05 MED ORDER — BUPIVACAINE-EPINEPHRINE 0.5% -1:200000 IJ SOLN
INTRAMUSCULAR | Status: DC | PRN
  Administered 2017-01-05: 2.5 mL

## 2017-01-05 MED ORDER — ASPIRIN EC 81 MG PO TBEC
81.0000 mg | DELAYED_RELEASE_TABLET | Freq: Every day | ORAL | Status: DC
  Administered 2017-01-05 – 2017-01-07 (×3): 81 mg via ORAL
  Filled 2017-01-05 (×3): qty 1

## 2017-01-05 MED ORDER — GLYCOPYRROLATE 0.2 MG/ML IJ SOLN
INTRAMUSCULAR | Status: DC | PRN
  Administered 2017-01-05: 0.2 mg via INTRAVENOUS

## 2017-01-05 MED ORDER — PROPOFOL 10 MG/ML IV BOLUS
INTRAVENOUS | Status: AC
  Filled 2017-01-05: qty 20

## 2017-01-05 MED ORDER — TURMERIC 500 MG PO CAPS: 1.0000 | ORAL_CAPSULE | Freq: Two times a day (BID) | ORAL | Status: DC

## 2017-01-05 MED ORDER — PANTOPRAZOLE SODIUM 40 MG IV SOLR
40.0000 mg | Freq: Every day | INTRAVENOUS | Status: DC
  Administered 2017-01-05: 40 mg via INTRAVENOUS
  Filled 2017-01-05: qty 40

## 2017-01-05 MED ORDER — ACETAMINOPHEN 10 MG/ML IV SOLN
INTRAVENOUS | Status: AC
  Filled 2017-01-05: qty 100

## 2017-01-05 MED ORDER — METHOCARBAMOL 500 MG PO TABS
ORAL_TABLET | ORAL | Status: AC
  Filled 2017-01-05: qty 1

## 2017-01-05 MED ORDER — OXYCODONE HCL ER 20 MG PO T12A
20.0000 mg | EXTENDED_RELEASE_TABLET | Freq: Two times a day (BID) | ORAL | Status: DC
  Administered 2017-01-05 – 2017-01-06 (×3): 20 mg via ORAL
  Filled 2017-01-05 (×3): qty 1

## 2017-01-05 MED ORDER — BUPIVACAINE LIPOSOME 1.3 % IJ SUSP
20.0000 mL | INTRAMUSCULAR | Status: AC
  Administered 2017-01-05: 20 mL
  Filled 2017-01-05: qty 20

## 2017-01-05 MED ORDER — LACTATED RINGERS IV SOLN
INTRAVENOUS | Status: DC | PRN
  Administered 2017-01-05 (×2): via INTRAVENOUS

## 2017-01-05 MED ORDER — BENAZEPRIL HCL 40 MG PO TABS
40.0000 mg | ORAL_TABLET | Freq: Every day | ORAL | Status: DC
  Administered 2017-01-05 – 2017-01-07 (×2): 40 mg via ORAL
  Filled 2017-01-05 (×3): qty 1

## 2017-01-05 MED ORDER — SUCCINYLCHOLINE CHLORIDE 200 MG/10ML IV SOSY
PREFILLED_SYRINGE | INTRAVENOUS | Status: AC
  Filled 2017-01-05: qty 10

## 2017-01-05 MED ORDER — ATENOLOL 25 MG PO TABS
25.0000 mg | ORAL_TABLET | Freq: Every day | ORAL | Status: DC
  Administered 2017-01-05 – 2017-01-07 (×2): 25 mg via ORAL
  Filled 2017-01-05 (×3): qty 1

## 2017-01-05 MED ORDER — LIDOCAINE 2% (20 MG/ML) 5 ML SYRINGE
INTRAMUSCULAR | Status: AC
  Filled 2017-01-05: qty 5

## 2017-01-05 MED ORDER — CEFAZOLIN SODIUM-DEXTROSE 2-4 GM/100ML-% IV SOLN
2.0000 g | INTRAVENOUS | Status: AC
  Administered 2017-01-05 (×2): 2 g via INTRAVENOUS

## 2017-01-05 MED ORDER — FENTANYL CITRATE (PF) 250 MCG/5ML IJ SOLN
INTRAMUSCULAR | Status: AC
  Filled 2017-01-05: qty 5

## 2017-01-05 MED ORDER — PROPOFOL 1000 MG/100ML IV EMUL
INTRAVENOUS | Status: AC
  Filled 2017-01-05: qty 200

## 2017-01-05 MED ORDER — SENNA 8.6 MG PO TABS
1.0000 | ORAL_TABLET | Freq: Two times a day (BID) | ORAL | Status: DC
  Administered 2017-01-05 – 2017-01-07 (×5): 8.6 mg via ORAL
  Filled 2017-01-05 (×5): qty 1

## 2017-01-05 MED ORDER — BUPIVACAINE-EPINEPHRINE (PF) 0.5% -1:200000 IJ SOLN
INTRAMUSCULAR | Status: AC
  Filled 2017-01-05: qty 30

## 2017-01-05 MED ORDER — MIDAZOLAM HCL 5 MG/5ML IJ SOLN
INTRAMUSCULAR | Status: DC | PRN
  Administered 2017-01-05: 2 mg via INTRAVENOUS

## 2017-01-05 MED ORDER — THROMBIN 5000 UNITS EX SOLR
CUTANEOUS | Status: AC
Start: 2017-01-05 — End: 2017-01-05
  Filled 2017-01-05: qty 10000

## 2017-01-05 MED ORDER — CEFAZOLIN SODIUM 1 G IJ SOLR
INTRAMUSCULAR | Status: AC
  Filled 2017-01-05: qty 20

## 2017-01-05 MED ORDER — FLEET ENEMA 7-19 GM/118ML RE ENEM: 1.0000 | ENEMA | Freq: Once | RECTAL | Status: DC | PRN

## 2017-01-05 MED ORDER — THROMBIN 5000 UNITS EX SOLR
CUTANEOUS | Status: AC
  Filled 2017-01-05: qty 10000

## 2017-01-05 MED ORDER — SODIUM CHLORIDE 0.9% FLUSH: 3.0000 mL | INTRAVENOUS | Status: DC | PRN

## 2017-01-05 MED ORDER — SODIUM CHLORIDE 0.9 % IV SOLN: 250.0000 mL | INTRAVENOUS | Status: DC

## 2017-01-05 MED ORDER — FENTANYL CITRATE (PF) 100 MCG/2ML IJ SOLN: 25.0000 ug | INTRAMUSCULAR | Status: DC | PRN

## 2017-01-05 MED ORDER — SODIUM CHLORIDE 0.9 % IV SOLN
INTRAVENOUS | Status: DC
  Administered 2017-01-05: 14:00:00 via INTRAVENOUS

## 2017-01-05 MED ORDER — GABAPENTIN 300 MG PO CAPS
300.0000 mg | ORAL_CAPSULE | Freq: Three times a day (TID) | ORAL | Status: DC
  Administered 2017-01-05 – 2017-01-07 (×6): 300 mg via ORAL
  Filled 2017-01-05 (×6): qty 1

## 2017-01-05 MED ORDER — VANCOMYCIN HCL 1000 MG IV SOLR
INTRAVENOUS | Status: DC | PRN
  Administered 2017-01-05: 1000 mg via TOPICAL

## 2017-01-05 MED ORDER — CHLORTHALIDONE 25 MG PO TABS
12.5000 mg | ORAL_TABLET | Freq: Every day | ORAL | Status: DC
  Administered 2017-01-05 – 2017-01-07 (×2): 12.5 mg via ORAL
  Filled 2017-01-05 (×3): qty 0.5

## 2017-01-05 MED ORDER — CELECOXIB 200 MG PO CAPS
200.0000 mg | ORAL_CAPSULE | Freq: Two times a day (BID) | ORAL | Status: DC
  Administered 2017-01-05 – 2017-01-07 (×5): 200 mg via ORAL
  Filled 2017-01-05 (×5): qty 1

## 2017-01-05 MED ORDER — LIDOCAINE-EPINEPHRINE 2 %-1:100000 IJ SOLN
INTRAMUSCULAR | Status: AC
  Filled 2017-01-05: qty 1

## 2017-01-05 MED ORDER — 0.9 % SODIUM CHLORIDE (POUR BTL) OPTIME
TOPICAL | Status: DC | PRN
  Administered 2017-01-05: 1000 mL

## 2017-01-05 MED ORDER — MENTHOL 3 MG MT LOZG: 1.0000 | LOZENGE | OROMUCOSAL | Status: DC | PRN

## 2017-01-05 MED ORDER — METHOCARBAMOL 500 MG PO TABS
ORAL_TABLET | ORAL | Status: AC
  Administered 2017-01-05: 750 mg
  Filled 2017-01-05: qty 1

## 2017-01-05 MED ORDER — ACETAMINOPHEN 10 MG/ML IV SOLN
INTRAVENOUS | Status: DC | PRN
  Administered 2017-01-05: 1000 mg via INTRAVENOUS

## 2017-01-05 MED ORDER — DIAZEPAM 5 MG PO TABS: 5.0000 mg | ORAL_TABLET | Freq: Four times a day (QID) | ORAL | Status: DC | PRN

## 2017-01-05 MED ORDER — METFORMIN HCL 500 MG PO TABS
500.0000 mg | ORAL_TABLET | Freq: Two times a day (BID) | ORAL | Status: DC
  Administered 2017-01-05 – 2017-01-07 (×4): 500 mg via ORAL
  Filled 2017-01-05 (×4): qty 1

## 2017-01-05 MED ORDER — METHOCARBAMOL 750 MG PO TABS
750.0000 mg | ORAL_TABLET | Freq: Four times a day (QID) | ORAL | Status: DC
  Administered 2017-01-05 – 2017-01-06 (×4): 750 mg via ORAL
  Filled 2017-01-05 (×4): qty 1

## 2017-01-05 MED ORDER — ONDANSETRON HCL 4 MG/2ML IJ SOLN: 4.0000 mg | Freq: Four times a day (QID) | INTRAMUSCULAR | Status: DC | PRN

## 2017-01-05 MED ORDER — DOXAZOSIN MESYLATE 8 MG PO TABS
8.0000 mg | ORAL_TABLET | Freq: Every day | ORAL | Status: DC
  Administered 2017-01-05 – 2017-01-07 (×2): 8 mg via ORAL
  Filled 2017-01-05 (×3): qty 1

## 2017-01-05 MED ORDER — SODIUM CHLORIDE 0.9 % IR SOLN
Status: DC | PRN
  Administered 2017-01-05: 500 mL

## 2017-01-05 MED ORDER — DOCUSATE SODIUM 100 MG PO CAPS
100.0000 mg | ORAL_CAPSULE | Freq: Two times a day (BID) | ORAL | Status: DC
Start: 2017-01-05 — End: 2017-01-07
  Administered 2017-01-05 – 2017-01-07 (×5): 100 mg via ORAL
  Filled 2017-01-05 (×5): qty 1

## 2017-01-05 MED ORDER — SURGIFOAM 100 EX MISC
CUTANEOUS | Status: DC | PRN
  Administered 2017-01-05: 20 mL via TOPICAL

## 2017-01-05 MED ORDER — LIDOCAINE-EPINEPHRINE 2 %-1:100000 IJ SOLN
INTRAMUSCULAR | Status: DC | PRN
  Administered 2017-01-05: 2.5 mL

## 2017-01-05 MED ORDER — ACETAMINOPHEN 500 MG PO TABS
1000.0000 mg | ORAL_TABLET | Freq: Four times a day (QID) | ORAL | Status: DC
  Administered 2017-01-05 – 2017-01-06 (×6): 1000 mg via ORAL
  Filled 2017-01-05 (×6): qty 2

## 2017-01-05 MED ORDER — BUPIVACAINE HCL (PF) 0.5 % IJ SOLN
INTRAMUSCULAR | Status: AC
  Filled 2017-01-05: qty 30

## 2017-01-05 MED ORDER — SODIUM CHLORIDE 0.9 % IJ SOLN
INTRAMUSCULAR | Status: AC
  Filled 2017-01-05: qty 20

## 2017-01-05 MED ORDER — INSULIN ASPART 100 UNIT/ML ~~LOC~~ SOLN
0.0000 [IU] | Freq: Three times a day (TID) | SUBCUTANEOUS | Status: DC
  Administered 2017-01-05: 5 [IU] via SUBCUTANEOUS
  Administered 2017-01-06 (×3): 3 [IU] via SUBCUTANEOUS

## 2017-01-05 MED ORDER — CEFAZOLIN SODIUM-DEXTROSE 1-4 GM/50ML-% IV SOLN
1.0000 g | Freq: Three times a day (TID) | INTRAVENOUS | Status: AC
  Administered 2017-01-05 – 2017-01-06 (×2): 1 g via INTRAVENOUS
  Filled 2017-01-05 (×2): qty 50

## 2017-01-05 MED ORDER — LIDOCAINE HCL (CARDIAC) 20 MG/ML IV SOLN
INTRAVENOUS | Status: DC | PRN
  Administered 2017-01-05: 100 mg via INTRAVENOUS

## 2017-01-05 MED ORDER — GLIMEPIRIDE 2 MG PO TABS
2.0000 mg | ORAL_TABLET | Freq: Every day | ORAL | Status: DC
  Administered 2017-01-06 – 2017-01-07 (×2): 2 mg via ORAL
  Filled 2017-01-05 (×2): qty 1

## 2017-01-05 MED ORDER — THROMBIN 20000 UNITS EX SOLR
CUTANEOUS | Status: AC
  Filled 2017-01-05: qty 20000

## 2017-01-05 MED ORDER — SODIUM CHLORIDE 0.9 % IJ SOLN
INTRAMUSCULAR | Status: DC | PRN
  Administered 2017-01-05: 20 mL

## 2017-01-05 MED ORDER — GELATIN ABSORBABLE MT POWD
OROMUCOSAL | Status: DC | PRN
  Administered 2017-01-05 (×3): 5 mL via TOPICAL

## 2017-01-05 MED ORDER — OXYCODONE HCL 5 MG PO TABS
5.0000 mg | ORAL_TABLET | ORAL | Status: DC | PRN
  Administered 2017-01-06: 10 mg via ORAL
  Filled 2017-01-05: qty 2

## 2017-01-05 MED ORDER — PHENYLEPHRINE 40 MCG/ML (10ML) SYRINGE FOR IV PUSH (FOR BLOOD PRESSURE SUPPORT)
PREFILLED_SYRINGE | INTRAVENOUS | Status: AC
Start: 2017-01-05 — End: 2017-01-05
  Filled 2017-01-05: qty 10

## 2017-01-05 MED ORDER — ONDANSETRON HCL 4 MG/2ML IJ SOLN
INTRAMUSCULAR | Status: AC
  Filled 2017-01-05: qty 2

## 2017-01-05 MED ORDER — VANCOMYCIN HCL 1000 MG IV SOLR
INTRAVENOUS | Status: AC
  Filled 2017-01-05: qty 1000

## 2017-01-05 MED FILL — Heparin Sodium (Porcine) Inj 1000 Unit/ML: INTRAMUSCULAR | Qty: 30 | Status: AC

## 2017-01-05 MED FILL — Sodium Chloride IV Soln 0.9%: INTRAVENOUS | Qty: 1000 | Status: AC

## 2017-01-05 SURGICAL SUPPLY — 109 items
BATTALION LLIF ITRADISCAL SHIM (MISCELLANEOUS) ×4
BIT DRILL LONG 3.0X30 (BIT) ×3 IMPLANT
BIT DRILL LONG 3.0X30MM (BIT) ×1
BIT DRILL LONG 3X80 (BIT) IMPLANT
BIT DRILL LONG 3X80MM (BIT)
BIT DRILL LONG 4X80 (BIT) IMPLANT
BIT DRILL LONG 4X80MM (BIT)
BIT DRILL SHORT 3.0X30 (BIT) ×3 IMPLANT
BIT DRILL SHORT 3.0X30MM (BIT) ×1
BIT DRILL SHORT 3X80 (BIT) IMPLANT
BIT DRILL SHORT 3X80MM (BIT)
BLADE CLIPPER SURG (BLADE) ×4 IMPLANT
BLADE SURG 11 STRL SS (BLADE) IMPLANT
BUR MATCHSTICK NEURO 3.0 LAGG (BURR) ×4 IMPLANT
CABLE BATTALION LLIF LIGHT (MISCELLANEOUS) ×4 IMPLANT
CARTRIDGE OIL MAESTRO DRILL (MISCELLANEOUS) ×2 IMPLANT
CHLORAPREP W/TINT 26ML (MISCELLANEOUS) ×8 IMPLANT
CONT SPEC 4OZ CLIKSEAL STRL BL (MISCELLANEOUS) ×12 IMPLANT
DECANTER SPIKE VIAL GLASS SM (MISCELLANEOUS) ×4 IMPLANT
DERMABOND ADVANCED (GAUZE/BANDAGES/DRESSINGS) ×4
DERMABOND ADVANCED .7 DNX12 (GAUZE/BANDAGES/DRESSINGS) ×4 IMPLANT
DIFFUSER DRILL AIR PNEUMATIC (MISCELLANEOUS) ×4 IMPLANT
DILATOR INSULATED XL 8X13 (MISCELLANEOUS) ×4 IMPLANT
DISSECTOR BLUNT TIP ENDO 5MM (MISCELLANEOUS) IMPLANT
DRAPE C-ARM 42X72 X-RAY (DRAPES) ×12 IMPLANT
DRAPE C-ARMOR (DRAPES) ×8 IMPLANT
DRAPE LAPAROTOMY 100X72X124 (DRAPES) ×4 IMPLANT
DRAPE POUCH INSTRU U-SHP 10X18 (DRAPES) ×8 IMPLANT
DRAPE SHEET LG 3/4 BI-LAMINATE (DRAPES) ×8 IMPLANT
DRAPE UNIVERSAL PACK (DRAPES) ×4 IMPLANT
DRSG OPSITE 4X5.5 SM (GAUZE/BANDAGES/DRESSINGS) ×4 IMPLANT
DRSG OPSITE POSTOP 4X6 (GAUZE/BANDAGES/DRESSINGS) ×4 IMPLANT
DRSG OPSITE POSTOP 4X8 (GAUZE/BANDAGES/DRESSINGS) ×4 IMPLANT
ELECT BLADE 4.0 EZ CLEAN MEGAD (MISCELLANEOUS) ×4
ELECT COATED BLADE 2.86 ST (ELECTRODE) ×4 IMPLANT
ELECT REM PT RETURN 9FT ADLT (ELECTROSURGICAL) ×8
ELECTRODE BLDE 4.0 EZ CLN MEGD (MISCELLANEOUS) ×2 IMPLANT
ELECTRODE REM PT RTRN 9FT ADLT (ELECTROSURGICAL) ×4 IMPLANT
EVACUATOR 1/8 PVC DRAIN (DRAIN) ×4 IMPLANT
FEE INTRAOP MONITOR IMPULS NCS (MISCELLANEOUS) ×2 IMPLANT
GAUZE SPONGE 4X4 16PLY XRAY LF (GAUZE/BANDAGES/DRESSINGS) IMPLANT
GLOVE BIO SURGEON STRL SZ7 (GLOVE) IMPLANT
GLOVE BIO SURGEON STRL SZ8 (GLOVE) ×4 IMPLANT
GLOVE BIO SURGEON STRL SZ8.5 (GLOVE) ×4 IMPLANT
GLOVE BIOGEL PI IND STRL 6.5 (GLOVE) ×4 IMPLANT
GLOVE BIOGEL PI IND STRL 7.0 (GLOVE) ×8 IMPLANT
GLOVE BIOGEL PI IND STRL 7.5 (GLOVE) ×4 IMPLANT
GLOVE BIOGEL PI INDICATOR 6.5 (GLOVE) ×4
GLOVE BIOGEL PI INDICATOR 7.0 (GLOVE) ×8
GLOVE BIOGEL PI INDICATOR 7.5 (GLOVE) ×4
GLOVE SS BIOGEL STRL SZ 7.5 (GLOVE) ×8 IMPLANT
GLOVE SUPERSENSE BIOGEL SZ 7.5 (GLOVE) ×8
GLOVE SURG SS PI 6.5 STRL IVOR (GLOVE) ×12 IMPLANT
GOWN STRL REUS W/ TWL LRG LVL3 (GOWN DISPOSABLE) ×12 IMPLANT
GOWN STRL REUS W/ TWL XL LVL3 (GOWN DISPOSABLE) ×2 IMPLANT
GOWN STRL REUS W/TWL 2XL LVL3 (GOWN DISPOSABLE) IMPLANT
GOWN STRL REUS W/TWL LRG LVL3 (GOWN DISPOSABLE) ×12
GOWN STRL REUS W/TWL XL LVL3 (GOWN DISPOSABLE) ×2
GRAFT BN 10X1XDBM MAGNIFUSE (Bone Implant) ×2 IMPLANT
GRAFT BONE MAGNIFUSE 1X10CM (Bone Implant) ×2 IMPLANT
GUIDEWIRE 320MM BLUNT TIP (WIRE) ×4 IMPLANT
HEMOSTAT POWDER KIT SURGIFOAM (HEMOSTASIS) ×12 IMPLANT
INTRAOP MONITOR FEE IMPULS NCS (MISCELLANEOUS) ×2
INTRAOP MONITOR FEE IMPULSE (MISCELLANEOUS) ×2
KIT BASIN OR (CUSTOM PROCEDURE TRAY) ×4 IMPLANT
KIT INFUSE X SMALL 1.4CC (Orthopedic Implant) ×4 IMPLANT
KIT ROOM TURNOVER OR (KITS) ×4 IMPLANT
KIT SPINE MAZOR X ROBO DISP (MISCELLANEOUS) ×4 IMPLANT
LEAD WIRE MULTI STAGE DISP (NEUROSURGERY SUPPLIES) ×4 IMPLANT
NEEDLE HYPO 21X1.5 SAFETY (NEEDLE) ×12 IMPLANT
NEEDLE HYPO 25X1 1.5 SAFETY (NEEDLE) ×4 IMPLANT
NS IRRIG 1000ML POUR BTL (IV SOLUTION) ×4 IMPLANT
OIL CARTRIDGE MAESTRO DRILL (MISCELLANEOUS) ×4
PACK LAMINECTOMY NEURO (CUSTOM PROCEDURE TRAY) ×8 IMPLANT
PATTIES SURGICAL .5X1.5 (GAUZE/BANDAGES/DRESSINGS) ×4 IMPLANT
PIN HEAD 2.5X60MM (PIN) IMPLANT
PROBE BALL TIP STIM 200MM 2.3M (NEUROSURGERY SUPPLIES) ×4 IMPLANT
PUTTY DBM GRAFTON 5CC (Putty) ×4 IMPLANT
ROD PC 5.5X70 TI ARSENAL (Rod) ×8 IMPLANT
SCREW CBX 6.5X35MM (Screw) ×24 IMPLANT
SCREW SCHANZ SA 4.0MM (MISCELLANEOUS) IMPLANT
SCREW SET SPINAL ARSENAL 47127 (Screw) ×24 IMPLANT
SHIM ITRADISCAL BATTALION LLIF (MISCELLANEOUS) ×2 IMPLANT
SPACER BATTALION 10D 22X10X60 (Spacer) ×4 IMPLANT
SPACER BATTALION 22X10X55M 10 (Spacer) ×4 IMPLANT
SPONGE LAP 4X18 X RAY DECT (DISPOSABLE) IMPLANT
SPONGE NEURO XRAY DETECT 1X3 (DISPOSABLE) ×4 IMPLANT
SPONGE SURGIFOAM ABS GEL 100 (HEMOSTASIS) ×4 IMPLANT
STAPLER VISISTAT 35W (STAPLE) ×4 IMPLANT
SUT STRATAFIX 1PDS 45CM VIOLET (SUTURE) ×4 IMPLANT
SUT STRATAFIX MNCRL+ 3-0 PS-2 (SUTURE) ×2
SUT STRATAFIX MONOCRYL 3-0 (SUTURE) ×2
SUT STRATAFIX SPIRAL + 2-0 (SUTURE) ×4 IMPLANT
SUT VIC AB 0 CT1 18XCR BRD8 (SUTURE) ×2 IMPLANT
SUT VIC AB 0 CT1 27 (SUTURE) ×2
SUT VIC AB 0 CT1 27XBRD ANBCTR (SUTURE) ×2 IMPLANT
SUT VIC AB 0 CT1 8-18 (SUTURE) ×2
SUT VIC AB 1 CT1 18XBRD ANBCTR (SUTURE) IMPLANT
SUT VIC AB 1 CT1 8-18 (SUTURE)
SUT VIC AB 2-0 CT1 18 (SUTURE) ×4 IMPLANT
SUT VIC AB 3-0 SH 8-18 (SUTURE) ×4 IMPLANT
SUTURE STRATFX MNCRL+ 3-0 PS-2 (SUTURE) ×2 IMPLANT
SYR 20CC LL (SYRINGE) ×4 IMPLANT
SYR 30ML LL (SYRINGE) ×4 IMPLANT
TIP TROCAR NITINOL ILLICO 20 (INSTRUMENTS) ×24 IMPLANT
TOWEL GREEN STERILE (TOWEL DISPOSABLE) ×8 IMPLANT
TOWEL GREEN STERILE FF (TOWEL DISPOSABLE) ×4 IMPLANT
TRAY FOLEY W/METER SILVER 16FR (SET/KITS/TRAYS/PACK) ×4 IMPLANT
WATER STERILE IRR 1000ML POUR (IV SOLUTION) ×4 IMPLANT

## 2017-01-05 NOTE — Transfer of Care (Signed)
Immediate Anesthesia Transfer of Care Note  Patient: Jeremiah Ray  Procedure(s) Performed: Right Lumbar three-four Transpsoas lumbar interbody fusion/Lumbar three-four laminectomy for decompression/Lumbar four-five Gill procedure/Lumbar three-five Posterior segemental instrumented fusion/Neuromonitoring/Mazor (Right Back) APPLICATION OF ROBOTIC ASSISTANCE FOR SPINAL PROCEDURE (N/A )  Patient Location: PACU  Anesthesia Type:General  Level of Consciousness: awake and alert   Airway & Oxygen Therapy: Patient Spontanous Breathing and Patient connected to nasal cannula oxygen  Post-op Assessment: Report given to RN and Post -op Vital signs reviewed and stable  Post vital signs: Reviewed and stable  Last Vitals:  Vitals:   01/05/17 0602 01/05/17 1300  BP: (!) 177/58 (!) 100/47  Pulse: (!) 50 (!) 51  Resp: 19 12  Temp: 36.6 C (!) 36.3 C  SpO2: 98% 97%    Last Pain:  Vitals:   01/05/17 1300  TempSrc:   PainSc: (P) Asleep         Complications: No apparent anesthesia complications

## 2017-01-05 NOTE — Progress Notes (Signed)
RT placed patient on CPAP HS. NO O2 bleed in needed. RT placed patient on home settings. Patient tolerating well.

## 2017-01-05 NOTE — Anesthesia Postprocedure Evaluation (Signed)
Anesthesia Post Note  Patient: Jeremiah Ray  Procedure(s) Performed: Right Lumbar three-four Transpsoas lumbar interbody fusion/Lumbar three-four laminectomy for decompression/Lumbar four-five Gill procedure/Lumbar three-five Posterior segemental instrumented fusion/Neuromonitoring/Mazor (Right Back) APPLICATION OF ROBOTIC ASSISTANCE FOR SPINAL PROCEDURE (N/A )     Patient location during evaluation: PACU Anesthesia Type: General Level of consciousness: awake and alert Pain management: pain level controlled Vital Signs Assessment: post-procedure vital signs reviewed and stable Respiratory status: spontaneous breathing, nonlabored ventilation, respiratory function stable and patient connected to nasal cannula oxygen Cardiovascular status: blood pressure returned to baseline and stable Postop Assessment: no apparent nausea or vomiting Anesthetic complications: no    Last Vitals:  Vitals:   01/05/17 1345 01/05/17 1400  BP: 136/62 (!) 135/58  Pulse: (!) 46 (!) 48  Resp: 14 13  Temp:    SpO2: 98% 95%    Last Pain:  Vitals:   01/05/17 1400  TempSrc:   PainSc: 0-No pain    LLE Motor Response: Purposeful movement;Responds to commands (01/05/17 1400) LLE Sensation: Full sensation (01/05/17 1400) RLE Motor Response: Purposeful movement;Responds to commands (01/05/17 1400) RLE Sensation: Full sensation (01/05/17 1400)      [place] Zarazua EDWARD

## 2017-01-05 NOTE — H&P (Signed)
CC:  No chief complaint on file. Back and leg pain  HPI: Jeremiah Ray is a 71 y.o. male with back and leg pain.  He has lumbosacral spondylosis and a spondylolisthesis with radiculopathy.  His symptoms haven't changed since I saw him last.  He presents for elective lumbar decompression and fusion.  PMH: Past Medical History:  Diagnosis Date  . Arthritis   . Cancer ([place])    basal cell carcinoma removed on head  . Diabetes mellitus without complication ([place])    type II  . Family history of adverse reaction to anesthesia    mother had confusion after surgery  . Heart murmur   . Hypertension   . Sleep apnea    uses CPAP    PSH: Past Surgical History:  Procedure Laterality Date  . ANTERIOR CRUCIATE LIGAMENT REPAIR Right   . APPENDECTOMY     2015  . HERNIA REPAIR     umbilical with mesh, 0017    SH: Social History  Substance Use Topics  . Smoking status: Never Smoker  . Smokeless tobacco: Never Used  . Alcohol use No    MEDS: Prior to Admission medications   Medication Sig Start Date End Date Taking? Authorizing Provider  amLODipine (NORVASC) 10 MG tablet Take 10 mg by mouth daily.   Yes [provider]  atenolol-chlorthalidone (TENORETIC) 50-25 MG tablet Take 0.5 tablets by mouth daily.   Yes [provider]  B Complex-C (SUPER B COMPLEX PO) Take 1 tablet by mouth daily.   Yes [provider]  benazepril (LOTENSIN) 40 MG tablet Take 40 mg by mouth daily.   Yes [provider]  doxazosin (CARDURA) 8 MG tablet Take 8 mg by mouth daily.   Yes [provider]  glimepiride (AMARYL) 2 MG tablet Take 2 mg by mouth daily with breakfast.   Yes [provider]  metFORMIN (GLUCOPHAGE) 500 MG tablet Take 500 mg by mouth 2 (two) times daily with a meal.   Yes [provider]  aspirin EC 81 MG tablet Take 81 mg by mouth daily.    [provider]  Omega-3 Fatty Acids (FISH OIL) 1000 MG CAPS Take 1 capsule by  mouth daily.    [provider]  Turmeric 500 MG CAPS Take 1 capsule by mouth 2 (two) times daily.    [provider]    ALLERGY: No Known Allergies  ROS: ROS  NEUROLOGIC EXAM: Awake, alert, oriented Memory and concentration grossly intact Speech fluent, appropriate CN grossly intact Motor exam: Upper Extremities Deltoid Bicep Tricep Grip  Right 5/5 5/5 5/5 5/5  Left 5/5 5/5 5/5 5/5   Lower Extremity IP Quad PF DF EHL  Right 5/5 5/5 5/5 5/5 5/5  Left 5/5 5/5 5/5 5/5 5/5   Sensation grossly intact to LT  IMAGING: No new imaging  IMPRESSION: - 71 y.o. male with spondylolisthesis and lumbosacral spondylosis with radiculopathy.  He presents for elective fusion.  He is neurologically intact.  PLAN: - L3-5 anterior lumbar interbody fusion via transpsoas approach; L3-5 laminectomy for decompression with L4-5 Gill procedure; L3-5 posterior segmental instrumented fusion - We have reviewed the risks, benefits, and alternatives to surgery and he wishes to proceed.

## 2017-01-05 NOTE — Anesthesia Procedure Notes (Signed)
Arterial Line Insertion Start/End10/10/2016 7:10 AM, 01/05/2017 7:20 AM Performed by: Ollen Bowl, CRNA  Patient location: Pre-op. Preanesthetic checklist: patient identified, IV checked, site marked, risks and benefits discussed, surgical consent, monitors and equipment checked and pre-op evaluation Lidocaine 1% used for infiltration Left, radial was placed Catheter size: 20 G Hand hygiene performed  and maximum sterile barriers used  Allen's test indicative of satisfactory collateral circulation Attempts: 1 Procedure performed without using ultrasound guided technique. Following insertion, dressing applied and Biopatch. Post procedure assessment: normal  Patient tolerated the procedure well with no immediate complications.

## 2017-01-05 NOTE — Brief Op Note (Signed)
01/05/2017  12:35 PM  PATIENT:  Jeremiah Ray  71 y.o. male  PRE-OPERATIVE DIAGNOSIS:  Spondylolisthesis, Lumbosacral region  POST-OPERATIVE DIAGNOSIS:  Spondylolisthesis, Lumbosacral region  PROCEDURE:  Procedure(s): Right Lumbar three-four Transpsoas lumbar interbody fusion/Lumbar three-four laminectomy for decompression/Lumbar four-five Gill procedure/Lumbar three-five Posterior segemental instrumented fusion/Neuromonitoring/Mazor (Right) APPLICATION OF ROBOTIC ASSISTANCE FOR SPINAL PROCEDURE (N/A)  SURGEON:  Surgeon(s) and Role:    * Kionte Baumgardner, Kevan Ny, MD - Primary    * Newman Pies, MD - Assisting  PHYSICIAN ASSISTANT:   ASSISTANTS: Newman Pies, MD   ANESTHESIA:   general  EBL:  Total I/O In: 1000 [I.V.:1000] Out: 5 [Urine:240; Blood:250]  BLOOD ADMINISTERED:none  DRAINS: none   LOCAL MEDICATIONS USED:  MARCAINE    and LIDOCAINE   SPECIMEN:  No Specimen  DISPOSITION OF SPECIMEN:  N/A  COUNTS:  YES  TOURNIQUET:  * No tourniquets in log *  DICTATION: .Dragon Dictation  PLAN OF CARE: Admit to inpatient   PATIENT DISPOSITION:  PACU - hemodynamically stable.   Delay start of Pharmacological VTE agent (>24hrs) due to surgical blood loss or risk of bleeding: yes

## 2017-01-05 NOTE — Progress Notes (Signed)
PHARMACIST - PHYSICIAN ORDER COMMUNICATION  CONCERNING: P&T Medication Policy on Herbal Medications  DESCRIPTION:  This patient's order for:  Tumeric  has been noted.  This product(s) is classified as an "herbal" or natural product. Due to a lack of definitive safety studies or FDA approval, nonstandard manufacturing practices, plus the potential risk of unknown drug-drug interactions while on inpatient medications, the Pharmacy and Therapeutics Committee does not permit the use of "herbal" or natural products of this type within [facility].   ACTION TAKEN: The pharmacy department is unable to verify this order at this time and your patient has been informed of this safety policy. Please reevaluate patient's clinical condition at discharge and address if the herbal or natural product(s) should be resumed at that time.   

## 2017-01-05 NOTE — Anesthesia Procedure Notes (Signed)
Procedure Name: Intubation Date/Time: 01/05/2017 8:00 AM Performed by: Ollen Bowl Pre-anesthesia Checklist: Patient identified, Emergency Drugs available, Suction available and Patient being monitored Patient Re-evaluated:Patient Re-evaluated prior to induction Oxygen Delivery Method: Circle system utilized Preoxygenation: Pre-oxygenation with 100% oxygen Induction Type: IV induction Ventilation: Mask ventilation without difficulty Laryngoscope Size: Miller and 3 Grade View: Grade I Tube type: Oral Tube size: 7.5 mm Number of attempts: 1 Airway Equipment and Method: Patient positioned with wedge pillow and Stylet Placement Confirmation: ETT inserted through vocal cords under direct vision,  positive ETCO2 and breath sounds checked- equal and bilateral Secured at: 23 cm Tube secured with: Tape Dental Injury: Teeth and Oropharynx as per pre-operative assessment

## 2017-01-05 NOTE — Evaluation (Signed)
Physical Therapy Evaluation Patient Details Name: Jeremiah Ray MRN: [id number] DOB: Sep 13, 1945 Today's Date: 01/05/2017   History of Present Illness  Patient is a 71 y/o male who presents s/p L3-4 TLIF, L 3-4 laminectomy for decompression, Lumbar 3-5 Posterior segemental instrumented fusion/Neuromonitoring. PMH includes HTN, DM, ca.  Clinical Impression  Patient presents with lethargy, soreness and post surgical deficits s/p above. Tolerated gait training with mod A for balance/safety and RW management. Right knee buckling x2 during mobility. Pt independent PTA and has support of spouse. Anticipate mobility will improve with increased alertness. Most likely needs RW for home. Education re: back precautions however pt not able to comprehend due to lethargy. Will follow acutely to maximize independence and mobility prior to return home.     Follow Up Recommendations Home health PT;Supervision for mobility/OOB    Equipment Recommendations  Rolling walker with 5" wheels    Recommendations for Other Services       Precautions / Restrictions Precautions Precautions: Back Precaution Booklet Issued: No Precaution Comments: Reviewed back precautions but pt too sleepy to comprehend. Required Braces or Orthoses: Spinal Brace Spinal Brace: Lumbar corset;Applied in sitting position Restrictions Weight Bearing Restrictions: No      Mobility  Bed Mobility Overal bed mobility: Needs Assistance Bed Mobility: Rolling;Sidelying to Sit;Sit to Sidelying Rolling: Min assist Sidelying to sit: Mod assist     Sit to sidelying: Min assist General bed mobility comments: HOB flat, no use of rails to stimulate home; cues for log roll technique, assist with rolling and to elevate trunk.  Transfers Overall transfer level: Needs assistance Equipment used: None Transfers: Sit to/from Stand Sit to Stand: Min assist         General transfer comment: Assist to power to standing with instability and  sway noted with LOB posteriorly requiring support.   Ambulation/Gait Ambulation/Gait assistance: Mod assist Ambulation Distance (Feet): 70 Feet Assistive device: Rolling walker (2 wheeled) Gait Pattern/deviations: Step-through pattern;Decreased stride length;Trunk flexed;Staggering right;Staggering left Gait velocity: decreased Gait velocity interpretation: Below normal speed for age/gender General Gait Details: Slow, unsteady gait with right knee buckling x2, assist with RW management. Cues for looking forward. Constant mod A for balance.   Stairs            Wheelchair Mobility    Modified Rankin (Stroke Patients Only)       Balance Overall balance assessment: Needs assistance Sitting-balance support: Feet supported;No upper extremity supported Sitting balance-Leahy Scale: Fair     Standing balance support: During functional activity;Bilateral upper extremity supported Standing balance-Leahy Scale: Poor Standing balance comment: Reliant on BUE for support in standing and external support due to BLE weakness.                              Pertinent Vitals/Pain Pain Assessment: Faces Faces Pain Scale: Hurts a little bit Pain Location: back and right side Pain Descriptors / Indicators: Operative site guarding;Sore Pain Intervention(s): Monitored during session;Repositioned    Home Living Family/patient expects to be discharged to:: Private residence Living Arrangements: Spouse/significant other Available Help at Discharge: Family;Available 24 hours/day Type of Home: House Home Access: Stairs to enter Entrance Stairs-Rails: Right Entrance Stairs-Number of Steps: 3-4 Home Layout: One level Home Equipment: Cane - single point      Prior Function Level of Independence: Independent               Hand Dominance        Extremity/Trunk  Assessment   Upper Extremity Assessment Upper Extremity Assessment: Defer to OT evaluation    Lower  Extremity Assessment Lower Extremity Assessment: LLE deficits/detail;RLE deficits/detail (Difficult to assess due to decreased level of arousal.) RLE Deficits / Details: Right knee instability noted during gait training but not formally assessed    Cervical / Trunk Assessment Cervical / Trunk Assessment: Other exceptions Cervical / Trunk Exceptions: s/p spine surgery  Communication   Communication: HOH  Cognition Arousal/Alertness: Lethargic Behavior During Therapy: Flat affect Overall Cognitive Status: Difficult to assess                                 General Comments: Difficult to sustain attention today secondary to decreased level of arousal.      General Comments General comments (skin integrity, edema, etc.): Family present during session.    Exercises     Assessment/Plan    PT Assessment Patient needs continued PT services  PT Problem List Decreased strength;Decreased mobility;Decreased knowledge of precautions;Decreased balance;Pain;Decreased knowledge of use of DME;Decreased cognition;Decreased activity tolerance       PT Treatment Interventions Therapeutic activities;Gait training;Therapeutic exercise;Patient/family education;Balance training;Functional mobility training;DME instruction    PT Goals (Current goals can be found in the Care Plan section)  Acute Rehab PT Goals Patient Stated Goal: to go to sleep PT Goal Formulation: With patient/family Time For Goal Achievement: 01/19/17 Potential to Achieve Goals: Fair    Frequency Min 5X/week   Barriers to discharge Inaccessible home environment stairs to enter home    Co-evaluation               AM-PAC PT "6 Clicks" Daily Activity  Outcome Measure Difficulty turning over in bed (including adjusting bedclothes, sheets and blankets)?: Unable Difficulty moving from lying on back to sitting on the side of the bed? : Unable Difficulty sitting down on and standing up from a chair with arms  (e.g., wheelchair, bedside commode, etc,.)?: Unable Help needed moving to and from a bed to chair (including a wheelchair)?: A Little Help needed walking in hospital room?: A Lot Help needed climbing 3-5 steps with a railing? : Total 6 Click Score: 9    End of Session Equipment Utilized During Treatment: Gait belt Activity Tolerance: Patient limited by lethargy Patient left: in bed;with call bell/phone within reach;with family/visitor present;with SCD's reapplied Nurse Communication: Mobility status PT Visit Diagnosis: Unsteadiness on feet (R26.81);Muscle weakness (generalized) (M62.81)    Time: 3149-7026 PT Time Calculation (min) (ACUTE ONLY): 18 min   Charges:   PT Evaluation $PT Eval Moderate Complexity: 1 Mod     PT G CodesWray Kearns, PT, DPT [phone]    Marguarite Arbour [place] Paster 01/05/2017, 3:37 PM

## 2017-01-06 LAB — BASIC METABOLIC PANEL
ANION GAP: 10 (ref 5–15)
BUN: 27 mg/dL — ABNORMAL HIGH (ref 6–20)
CHLORIDE: 102 mmol/L (ref 101–111)
CO2: 26 mmol/L (ref 22–32)
CREATININE: 1.48 mg/dL — AB (ref 0.61–1.24)
Calcium: 8.6 mg/dL — ABNORMAL LOW (ref 8.9–10.3)
GFR calc non Af Amer: 45 mL/min — ABNORMAL LOW (ref >60)
GFR, EST AFRICAN AMERICAN: 52 mL/min — AB (ref >60)
Glucose, Bld: 149 mg/dL — ABNORMAL HIGH (ref 65–99)
POTASSIUM: 4 mmol/L (ref 3.5–5.1)
SODIUM: 138 mmol/L (ref 135–145)

## 2017-01-06 LAB — CBC
HCT: 31.4 % — ABNORMAL LOW (ref 39.0–52.0)
HEMOGLOBIN: 10.4 g/dL — AB (ref 13.0–17.0)
MCH: 30.1 pg (ref 26.0–34.0)
MCHC: 33.1 g/dL (ref 30.0–36.0)
MCV: 90.8 fL (ref 78.0–100.0)
Platelets: 189 10*3/uL (ref 150–400)
RBC: 3.46 MIL/uL — AB (ref 4.22–5.81)
RDW: 13.4 % (ref 11.5–15.5)
WBC: 10.5 10*3/uL (ref 4.0–10.5)

## 2017-01-06 LAB — GLUCOSE, CAPILLARY
GLUCOSE-CAPILLARY: 178 mg/dL — AB (ref 65–99)
GLUCOSE-CAPILLARY: 178 mg/dL — AB (ref 65–99)
GLUCOSE-CAPILLARY: 156 mg/dL — AB (ref 65–99)
GLUCOSE-CAPILLARY: 176 mg/dL — AB (ref 65–99)

## 2017-01-06 MED ORDER — TAMSULOSIN HCL 0.4 MG PO CAPS
0.4000 mg | ORAL_CAPSULE | Freq: Every day | ORAL | Status: DC
  Administered 2017-01-06 – 2017-01-07 (×2): 0.4 mg via ORAL
  Filled 2017-01-06 (×2): qty 1

## 2017-01-06 MED ORDER — PANTOPRAZOLE SODIUM 40 MG PO TBEC
40.0000 mg | DELAYED_RELEASE_TABLET | Freq: Every day | ORAL | Status: DC
  Administered 2017-01-06: 40 mg via ORAL
  Filled 2017-01-06: qty 1

## 2017-01-06 MED FILL — Gelatin Absorbable MT Powder: OROMUCOSAL | Qty: 1 | Status: AC

## 2017-01-06 MED FILL — Thrombin For Soln 5000 Unit: CUTANEOUS | Qty: 5000 | Status: AC

## 2017-01-06 NOTE — Progress Notes (Signed)
Physical Therapy Treatment Patient Details Name: Jeremiah Ray MRN: [id number] DOB: 1945-05-19 Today's Date: 01/06/2017    History of Present Illness Patient is a 71 y/o male who presents s/p L3-4 TLIF, L 3-4 laminectomy for decompression, Lumbar 3-5 Posterior segemental instrumented fusion/Neuromonitoring. PMH includes HTN, DM, ca.    PT Comments    Pt progressing towards physical therapy goals. Session limited by sudden onset of nausea during gait training. We were not able to attempt stair training at this time, however pt needs to practice 4 steps prior to d/c home tomorrow. Will continue to follow.    Follow Up Recommendations  Home health PT;Supervision for mobility/OOB     Equipment Recommendations  Rolling walker with 5" wheels    Recommendations for Other Services       Precautions / Restrictions Precautions Precautions: Back Precaution Booklet Issued: Yes (comment) Precaution Comments: Reviewed precaution handout with pt and wife  Required Braces or Orthoses: Spinal Brace Spinal Brace: Lumbar corset;Applied in sitting position Restrictions Weight Bearing Restrictions: No    Mobility  Bed Mobility Overal bed mobility: Needs Assistance Bed Mobility: Rolling;Sidelying to Sit Rolling: Min guard Sidelying to sit: Min guard       General bed mobility comments: Pt received sitting up in chair after OT session.   Transfers Overall transfer level: Needs assistance Equipment used: Rolling walker (2 wheeled) Transfers: Sit to/from Stand Sit to Stand: Min guard         General transfer comment: Close guard for safety as pt powered-up to full standing position. Required cues to wait for RW to be in front of him prior to initiating stand.   Ambulation/Gait Ambulation/Gait assistance: Min guard Ambulation Distance (Feet): 200 Feet Assistive device: Rolling walker (2 wheeled) Gait Pattern/deviations: Step-through pattern;Decreased stride length;Trunk  flexed;Staggering right;Staggering left Gait velocity: decreased Gait velocity interpretation: Below normal speed for age/gender General Gait Details: Slow but generally steady with RW. Pt reports he has been ambulating well in the halls overnight, however this session was limited by sudden onset of nausea during gait training. Pt required severeal standing rest breaks and was dry heaving before able to make it back to the room. RN aware.   Stairs Stairs:  (Unable to tolerate stair training this session 2 nausea)          Wheelchair Mobility    Modified Rankin (Stroke Patients Only)       Balance Overall balance assessment: Needs assistance Sitting-balance support: Feet supported;No upper extremity supported Sitting balance-Leahy Scale: Fair     Standing balance support: During functional activity;Bilateral upper extremity supported Standing balance-Leahy Scale: Fair Standing balance comment: Reliant on BUE for support in standing and external support due to BLE weakness.                             Cognition Arousal/Alertness: Awake/alert Behavior During Therapy: WFL for tasks assessed/performed Overall Cognitive Status: Within Functional Limits for tasks assessed                                        Exercises      General Comments General comments (skin integrity, edema, etc.): Pt's wife present during session and participated in all education .      Pertinent Vitals/Pain Pain Assessment: 0-10 Pain Score: 3  Faces Pain Scale: Hurts little more Pain Location: back and  right side Pain Descriptors / Indicators: Operative site guarding;Sore Pain Intervention(s): Monitored during session    Home Living Family/patient expects to be discharged to:: Private residence Living Arrangements: Spouse/significant other Available Help at Discharge: Family;Available 24 hours/day Type of Home: House Home Access: Stairs to enter Entrance  Stairs-Rails: Right Home Layout: One level        Prior Function Level of Independence: Independent      Comments: 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    PT Goals (current goals can now be found in the care plan section) Acute Rehab PT Goals Patient Stated Goal: To return to PLOF PT Goal Formulation: With patient/family Time For Goal Achievement: 01/19/17 Potential to Achieve Goals: Fair Progress towards PT goals: Progressing toward goals    Frequency    Min 5X/week      PT Plan Current plan remains appropriate    Co-evaluation              AM-PAC PT "6 Clicks" Daily Activity  Outcome Measure  Difficulty turning over in bed (including adjusting bedclothes, sheets and blankets)?: A Little Difficulty moving from lying on back to sitting on the side of the bed? : A Little Difficulty sitting down on and standing up from a chair with arms (e.g., wheelchair, bedside commode, etc,.)?: A Little Help needed moving to and from a bed to chair (including a wheelchair)?: A Little Help needed walking in hospital room?: A Little Help needed climbing 3-5 steps with a railing? : A Little 6 Click Score: 18    End of Session Equipment Utilized During Treatment: Gait belt Activity Tolerance: Patient limited by lethargy Patient left: with call bell/phone within reach;with family/visitor present (Sitting EOB eating breakfast) Nurse Communication: Mobility status PT Visit Diagnosis: Unsteadiness on feet (R26.81);Muscle weakness (generalized) (M62.81)     Time: 9509-3267 PT Time Calculation (min) (ACUTE ONLY): 12 min  Charges:  $Gait Training: 8-22 mins                    G Codes:       Rolinda Roan, PT, DPT Acute Rehabilitation Services Pager: [phone]    Thelma Comp 01/06/2017, 8:58 AM

## 2017-01-06 NOTE — Progress Notes (Signed)
OT Note - Addendum    01/06/17 0900  OT Visit Information  Last OT Received On 01/06/17  OT Time Calculation  OT Start Time (ACUTE ONLY) 2446  OT Stop Time (ACUTE ONLY) 0834  OT Time Calculation (min) 26 min  OT General Charges  $OT Visit 1 Visit  OT Evaluation  $OT Eval Low Complexity 1 Low  OT Treatments  $Self Care/Home Management  8-22 mins  [facility], OT/L  [phone] 01/06/2017

## 2017-01-06 NOTE — Progress Notes (Deleted)
Pt's hemovac accidentally came off while pt was asleep. Last recorded output at this time was 10 cc. Will continue to monitor.

## 2017-01-06 NOTE — Progress Notes (Signed)
No acute events Pan well controlled Some right anterior thigh pain/numbness Moving legs well PT/OT Hopefully home tomorrow

## 2017-01-06 NOTE — Progress Notes (Signed)
Pt unable to void, bladder scan performed and it was 538cc. Foley was placed per protocol with dark yellow urine placed. Will continue to monitor Pt. Holli Humbles, RN

## 2017-01-06 NOTE — Therapy (Signed)
Occupational Therapy Evaluation Patient Details Name: Jeremiah Ray MRN: [id number] DOB: [birth year] Today's Date: 01/06/2017    History of Present Illness Patient is a 71 y/o male who presents s/p L3-4 TLIF, L 3-4 laminectomy for decompression, Lumbar 3-5 Posterior segemental instrumented fusion/Neuromonitoring. PMH includes HTN, DM, ca.   Clinical Impression   Pt reports being independent in ADLs and was working full time PTA. Currently, pt requires min assist for LB ADLs and functional mobility at RW level. Pt required supervision and verbal cues for bed mobility and to don back brace. Pt reports spouse is available 24/7 to provide assistance as needed upon d/c home. Pt would benefit from acute OT services to increase independence with LB ADLs and functional mobility. OT will follow acutely to address established goals.      Follow Up Recommendations  No OT follow up;Supervision/Assistance - intermittent    Equipment Recommendations  3 in 1 bedside commode    Recommendations for Other Services       Precautions / Restrictions Precautions Precautions: Back Precaution Booklet Issued: Yes (comment) Precaution Comments: Reviewed precaution handout with pt and wife  Required Braces or Orthoses: Spinal Brace Spinal Brace: Lumbar corset;Applied in sitting position Restrictions Weight Bearing Restrictions: No      Mobility Bed Mobility Overal bed mobility: Needs Assistance Bed Mobility: Rolling;Sidelying to Sit Rolling: Min guard Sidelying to sit: Min guard       General bed mobility comments: HOB flat, verbal cues for log roll technique, increased time but no physical assist required.   Transfers Overall transfer level: Needs assistance Equipment used: Rolling walker (2 wheeled) Transfers: Sit to/from Stand Sit to Stand: Min guard              Balance Overall balance assessment: Needs assistance Sitting-balance support: Feet supported;No upper extremity  supported Sitting balance-Leahy Scale: Fair     Standing balance support: During functional activity;Bilateral upper extremity supported Standing balance-Leahy Scale: Fair                             ADL either performed or assessed with clinical judgement   ADL Overall ADL's : Needs assistance/impaired     Grooming: Supervision/safety;Set up;Sitting Grooming Details (indicate cue type and reason): Pt educated on two cup technique to maintain back precautions during oral care.  Upper Body Bathing: Supervision/ safety;Set up;Sitting   Lower Body Bathing: Minimal assistance;Sit to/from stand   Upper Body Dressing : Supervision/safety;Set up;Sitting Upper Body Dressing Details (indicate cue type and reason): Verbal cues for technique to don back brace.  Lower Body Dressing: Minimal assistance;Sit to/from stand    Toilet Transfer: Min guard;Ambulation Toilet Transfer Details (indicate cue type and reason): Simulated sit to stand from EOB          Functional mobility during ADLs: Minimal assistance;Rolling walker General ADL Comments: Pt educated on compensatory techniques for ADLs with back precautions. Pt currently unable to bring feet over knees to complete LB ADLs. Pt interested in AE to increase independence with LB ADLs and peri care.     Vision Baseline Vision/History: Wears glasses Wears Glasses: At all times       Perception     Praxis      Pertinent Vitals/Pain Pain Assessment: Faces Faces Pain Scale: Hurts little more Pain Location: back and right side Pain Descriptors / Indicators: Operative site guarding;Sore Pain Intervention(s): Monitored during session     Hand Dominance Right   Extremity/Trunk Assessment  Upper Extremity Assessment Upper Extremity Assessment: Overall WFL for tasks assessed   Lower Extremity Assessment Lower Extremity Assessment: Defer to PT evaluation   Cervical / Trunk Assessment Cervical / Trunk Assessment: Other  exceptions Cervical / Trunk Exceptions: s/p spine surgery   Communication Communication Communication: HOH   Cognition Arousal/Alertness: Awake/alert Behavior During Therapy: WFL for tasks assessed/performed Overall Cognitive Status: Within Functional Limits for tasks assessed                                     General Comments  Pt's wife present during session and participated in all education .    Exercises     Shoulder Instructions      Home Living Family/patient expects to be discharged to:: Private residence Living Arrangements: Spouse/significant other Available Help at Discharge: Family;Available 24 hours/day Type of Home: House Home Access: Stairs to enter [employer] of Steps: 3-4 Entrance Stairs-Rails: Right Home Layout: One level     Bathroom Shower/Tub: Walk-in shower;Door    [employer] Toilet: Standard Bathroom Accessibility: Yes How Accessible: Accessible via walker  Home Equipment: Shower seat - built in             Prior Functioning/Environment Level of Independence: Independent        Comments: 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         OT Problem List: Decreased knowledge of use of DME or AE;Decreased knowledge of precautions;Decreased safety awareness;Impaired balance (sitting and/or standing)      OT Treatment/Interventions: DME and/or AE instruction;Self-care/ADL training;Therapeutic exercise;Therapeutic activities;Patient/family education;Balance training    OT Goals(Current goals can be found in the care plan section) Acute Rehab OT Goals Patient Stated Goal: To return to PLOF OT Goal Formulation: With patient Time For Goal Achievement: 01/20/17 Potential to Achieve Goals: Good ADL Goals Pt Will Perform Lower Body Bathing: with supervision;sit to/from stand (AE as needed) Pt Will Perform Lower Body Dressing: with supervision;sit to/from stand (AE as needed) Pt Will Transfer to Toilet:  with supervision;ambulating;regular height toilet (RW, 3in1) Pt Will Perform Toileting - Clothing Manipulation and hygiene: with supervision;sit to/from stand (AE as needed) Pt Will Perform Tub/Shower Transfer: Shower transfer;with supervision;ambulating;shower seat;rolling walker Additional ADL Goal #1: Pt will independently verbalize 3/3 back precautions.   OT Frequency: Min 2X/week   Barriers to D/C:            Co-evaluation              AM-PAC PT "6 Clicks" Daily Activity     Outcome Measure Help from another person eating meals?: None Help from another person taking care of personal grooming?: A Little Help from another person toileting, which includes using toliet, bedpan, or urinal?: A Little Help from another person bathing (including washing, rinsing, drying)?: A Little Help from another person to put on and taking off regular upper body clothing?: A little Help from another person to put on and taking off regular lower body clothing?: A Little 6 Click Score: 19   End of Session Equipment Utilized During Treatment: Gait belt;Rolling walker;Back brace Nurse Communication: Mobility status  Activity Tolerance: Patient tolerated treatment well Patient left: in chair;with call bell/phone within reach;with family/visitor present  OT Visit Diagnosis: Unsteadiness on feet (R26.81);Pain Pain - Right/Left: Right Pain - part of body: Leg (Back)  Time: 3202-3343 OT Time Calculation (min): 26 min Charges:    G-Codes:     Boykin Peek, OTS [phone]   Boykin Peek 01/06/2017, 8:45 AM

## 2017-01-07 LAB — BASIC METABOLIC PANEL
Anion gap: 6 (ref 5–15)
BUN: 29 mg/dL — AB (ref 6–20)
CHLORIDE: 100 mmol/L — AB (ref 101–111)
CO2: 27 mmol/L (ref 22–32)
CREATININE: 1.37 mg/dL — AB (ref 0.61–1.24)
Calcium: 8 mg/dL — ABNORMAL LOW (ref 8.9–10.3)
GFR calc Af Amer: 57 mL/min — ABNORMAL LOW (ref >60)
GFR, EST NON AFRICAN AMERICAN: 50 mL/min — AB (ref >60)
GLUCOSE: 139 mg/dL — AB (ref 65–99)
POTASSIUM: 3.7 mmol/L (ref 3.5–5.1)
Sodium: 133 mmol/L — ABNORMAL LOW (ref 135–145)

## 2017-01-07 LAB — GLUCOSE, CAPILLARY
Glucose-Capillary: 127 mg/dL — ABNORMAL HIGH (ref 65–99)
Glucose-Capillary: 167 mg/dL — ABNORMAL HIGH (ref 65–99)

## 2017-01-07 MED ORDER — METHOCARBAMOL 750 MG PO TABS: 750.0000 mg | ORAL_TABLET | Freq: Four times a day (QID) | ORAL | 2 refills | Status: DC

## 2017-01-07 MED ORDER — TAMSULOSIN HCL 0.4 MG PO CAPS
0.4000 mg | ORAL_CAPSULE | Freq: Every day | ORAL | 1 refills | Status: AC
Start: 2017-01-08 — End: ?

## 2017-01-07 MED ORDER — HYDROCODONE-ACETAMINOPHEN 5-325 MG PO TABS: 1.0000 | ORAL_TABLET | ORAL | 0 refills | Status: AC | PRN

## 2017-01-07 NOTE — Progress Notes (Signed)
OT note addendum for charges    01/07/17 1037  OT Time Calculation  OT Start Time (ACUTE ONLY) 0925  OT Stop Time (ACUTE ONLY) 0941  OT Time Calculation (min) 16 min  OT General Charges  $OT Visit 1 Visit  OT Treatments  $Self Care/Home Management  8-22 mins   Imaad Reuss A. Ulice Brilliant, M.S., OTR/L Pager: [phone]

## 2017-01-07 NOTE — Progress Notes (Signed)
Urinary retention overnight Moves legs well D/c this afternoon, may need leg bag

## 2017-01-07 NOTE — Discharge Summary (Signed)
Physician Discharge Summary  Patient ID: Jeremiah Ray MRN: [id number] DOB/AGE: 07-05-1945 71 y.o.  Admit date: 01/05/2017 Discharge date: 01/07/2017  Admission Diagnoses:  Lumbosacral spondylolisthesis   Discharge Diagnoses:  Same Active Problems:   Spondylolisthesis of lumbosacral region   Discharged Condition: Stable  Hospital Course:  Jeremiah Ray is a 71 y.o. male who was admitted for the below procedure. There were no post operative complications. At time of discharge, pain was well controlled, ambulating with Pt/OT, tolerating po, voiding normal. Ready for discharge.  Treatments: Surgery Right Lumbar three-four Transpsoas lumbar interbody fusion/Lumbar three-four laminectomy for decompression/Lumbar four-five Gill procedure/Lumbar three-five Posterior segemental instrumented fusion/Neuromonitoring/[place] (Right) APPLICATION OF ROBOTIC ASSISTANCE FOR SPINAL PROCEDURE (N/A)  Discharge Exam: Blood pressure (!) 128/52, pulse 63, temperature 97.8 F (36.6 C), temperature source Oral, resp. rate 18, height 5\' 8"  (1.727 m), weight 91.6 kg (202 lb), SpO2 93 %. Awake, alert, oriented Speech fluent, appropriate CN grossly intact MAEW with good strength Wound c/d/i  Disposition: Home  Discharge Instructions    Call MD for:  difficulty breathing, headache or visual disturbances    Complete by:  As directed    Call MD for:  persistant dizziness or light-headedness    Complete by:  As directed    Call MD for:  redness, tenderness, or signs of infection (pain, swelling, redness, odor or green/yellow discharge around incision site)    Complete by:  As directed    Call MD for:  severe uncontrolled pain    Complete by:  As directed    Call MD for:  temperature >100.4    Complete by:  As directed    Diet general    Complete by:  As directed    Driving Restrictions    Complete by:  As directed    Do not drive until given clearance.   Increase activity slowly    Complete  by:  As directed    Lifting restrictions    Complete by:  As directed    Do not lift anything >10lbs. Avoid bending and twisting in awkward positions. Avoid bending at the back.   May shower / Bathe    Complete by:  As directed    In 24 hours. Okay to wash wound with warm soapy water. Avoid scrubbing the wound. Pat dry.   Remove dressing in 24 hours    Complete by:  As directed      Allergies as of 01/07/2017   No Known Allergies     Medication List    TAKE these medications   amLODipine 10 MG tablet Commonly known as:  NORVASC Take 10 mg by mouth daily.   aspirin EC 81 MG tablet Take 81 mg by mouth daily.   atenolol-chlorthalidone 50-25 MG tablet Commonly known as:  TENORETIC Take 0.5 tablets by mouth daily.   benazepril 40 MG tablet Commonly known as:  LOTENSIN Take 40 mg by mouth daily.   doxazosin 8 MG tablet Commonly known as:  CARDURA Take 8 mg by mouth daily.   Fish Oil 1000 MG Caps Take 1 capsule by mouth daily.   glimepiride 2 MG tablet Commonly known as:  AMARYL Take 2 mg by mouth daily with breakfast.   HYDROcodone-acetaminophen 5-325 MG tablet Commonly known as:  NORCO/VICODIN Take 1-2 tablets by mouth every 4 (four) hours as needed for moderate pain.   metFORMIN 500 MG tablet Commonly known as:  GLUCOPHAGE Take 500 mg by mouth 2 (two) times daily with a meal.  methocarbamol 750 MG tablet Commonly known as:  ROBAXIN Take 1 tablet (750 mg total) by mouth 4 (four) times daily.   SUPER B COMPLEX PO Take 1 tablet by mouth daily.   tamsulosin 0.4 MG Caps capsule Commonly known as:  FLOMAX Take 1 capsule (0.4 mg total) by mouth daily.   Turmeric 500 MG Caps Take 1 capsule by mouth 2 (two) times daily.            Durable Medical Equipment        Start     Ordered   01/06/17 1300  For home use only DME 3 n 1  Once     01/06/17 1300   01/06/17 1300  For home use only DME Walker rolling  Once    Question:  Patient needs a walker to  treat with the following condition  Answer:  Status post spinal surgery   01/06/17 1300     Follow-up Information    Ditty, Kevan Ny, MD. Schedule an appointment as soon as possible for a visit in 3 week(s).   Specialty:  Neurosurgery Contact information: [place] [address] [phone]        [facility], [facility] Follow up.   Why:  Call for appointment due to urinary retention Contact information: [place] [address] [phone]           Signed: Traci Sermon 01/07/2017, 9:37 AM

## 2017-01-07 NOTE — Therapy (Signed)
Occupational Therapy Treatment Patient Details Name: Jeremiah Ray MRN: [id number] DOB: October 22, 1945 Today's Date: 01/07/2017    History of present illness Patient is a 71 y/o male who presents s/p L3-4 TLIF, L 3-4 laminectomy for decompression, Lumbar 3-5 Posterior segemental instrumented fusion/Neuromonitoring. PMH includes HTN, DM, ca.   OT comments  Focus of today's session on increased independence with LB ADLs. Pt educated on AE to complete LB bathing and dressing as well as AE for peri care. Pt able to return demonstration of AE and is interested is purchasing the kit. Pt would benefit from continued OT services to increase independence with functional mobility. OT will continue to follow acutely to address established.     Follow Up Recommendations  No OT follow up;Supervision/Assistance - 24 hour    Equipment Recommendations  3 in 1 bedside commode    Recommendations for Other Services      Precautions / Restrictions Precautions Precautions: Back Precaution Booklet Issued: Yes (comment) Precaution Comments: Pt able to recall 3/3 back precautions  Required Braces or Orthoses: Spinal Brace Spinal Brace: Lumbar corset;Applied in sitting position Restrictions Weight Bearing Restrictions: No       Mobility Bed Mobility       General bed mobility comments: Pt sitting in chair up OT arrival.   Transfers Overall transfer level: Needs assistance Equipment used: Rolling walker (2 wheeled) Transfers: Sit to/from Stand Sit to Stand: Supervision         General transfer comment: Pt completed AE education while seated in chair.     Balance Overall balance assessment: Needs assistance Sitting-balance support: Feet supported;No upper extremity supported Sitting balance-Leahy Scale: Good Sitting balance - Comments: Pt able to don socks with sock aide while seated with no LOB                               ADL either performed or assessed with clinical  judgement   ADL Overall ADL's : Needs assistance/impaired             Lower Body Bathing: Supervison/ safety;Set up;With adaptive equipment;Sit to/from stand Lower Body Bathing Details (indicate cue type and reason): Pt educated on use of long handle sponge to maintain back precautions while bathing LB.      Lower Body Dressing: Supervision/safety;Set up;Sit to/from stand;With adaptive equipment Lower Body Dressing Details (indicate cue type and reason): Pt able to don socks with sock aide and reacher.      Toileting- Clothing Manipulation and Hygiene: Supervision/safety;Set up;With adaptive equipment;Sit to/from stand Toileting - Clothing Manipulation Details (indicate cue type and reason): Pt educated on use of toilet aide for peri care.    Tub/Shower Transfer Details (indicate cue type and reason): Pt advised to use shower seat while showering and to have wife present initially up d/c for safety.    General ADL Comments: Pt interested in purchasing AE. Pt advised on where he can get the equipment.      Vision       Perception     Praxis      Cognition Arousal/Alertness: Awake/alert Behavior During Therapy: WFL for tasks assessed/performed Overall Cognitive Status: Within Functional Limits for tasks assessed                                          Exercises     Shoulder  Instructions       General Comments Wife present during session and participated in all education. Pts primary concern is urinary retention.     Pertinent Vitals/ Pain       Pain Assessment: 0-10 Pain Score: 4  Faces Pain Scale: Hurts a little bit Pain Location: back and right leg Pain Descriptors / Indicators: Operative site guarding;Sore Pain Intervention(s): Monitored during session  Home Living                                          Prior Functioning/Environment              Frequency  Min 2X/week        Progress Toward Goals  OT  Goals(current goals can now be found in the care plan section)  Progress towards OT goals: Progressing toward goals  Acute Rehab OT Goals Patient Stated Goal: To go home and be able to use the bathroom on his own OT Goal Formulation: With patient Time For Goal Achievement: 01/20/17 Potential to Achieve Goals: Good ADL Goals Pt Will Perform Lower Body Bathing: with supervision;sit to/from stand Pt Will Perform Lower Body Dressing: with supervision;sit to/from stand Pt Will Transfer to Toilet: with supervision;ambulating;regular height toilet Pt Will Perform Toileting - Clothing Manipulation and hygiene: with supervision;sit to/from stand Pt Will Perform Tub/Shower Transfer: Shower transfer;with supervision;ambulating;shower seat;rolling walker Additional ADL Goal #1: Pt will independently verbalize 3/3 back precautions.   Plan Discharge plan remains appropriate    Co-evaluation                 AM-PAC PT "6 Clicks" Daily Activity     Outcome Measure   Help from another person eating meals?: None Help from another person taking care of personal grooming?: None Help from another person toileting, which includes using toliet, bedpan, or urinal?: None Help from another person bathing (including washing, rinsing, drying)?: None Help from another person to put on and taking off regular upper body clothing?: None Help from another person to put on and taking off regular lower body clothing?: None 6 Click Score: 24    End of Session Equipment Utilized During Treatment: Back brace (AE )  OT Visit Diagnosis: Unsteadiness on feet (R26.81);Pain Pain - Right/Left: Right Pain - part of body: Leg   Activity Tolerance     Patient Left in chair;with call bell/phone within reach;with family/visitor present   Nurse Communication Mobility status        Time: 5486-8852 OT Time Calculation (min): 16 min  Charges:    Boykin Peek, OTS [phone]    Boykin Peek 01/07/2017, 10:45 AM

## 2017-01-07 NOTE — Progress Notes (Signed)
Physical Therapy Treatment Patient Details Name: Jeremiah Ray MRN: [id number] DOB: [birth year] Today's Date: 01/07/2017    History of Present Illness Patient is a 71 y/o male who presents s/p L3-4 TLIF, L 3-4 laminectomy for decompression, Lumbar 3-5 Posterior segemental instrumented fusion/Neuromonitoring. PMH includes HTN, DM, ca.    PT Comments    Pt progressing towards physical therapy goals. Was able to perform transfers and ambulation with gross min guard assist to supervision for safety. Pt reports continuous lightheadedness throughout OOB mobility. Was not limited in mobility, however was not improving throughout gait training. Will continue to follow and progress as able per POC.    Follow Up Recommendations  Home health PT;Supervision for mobility/OOB     Equipment Recommendations  Rolling walker with 5" wheels    Recommendations for Other Services       Precautions / Restrictions Precautions Precautions: Back Precaution Booklet Issued: Yes (comment) Precaution Comments: Reviewed precaution handout with pt and wife  Required Braces or Orthoses: Spinal Brace Spinal Brace: Lumbar corset;Applied in sitting position Restrictions Weight Bearing Restrictions: No    Mobility  Bed Mobility Overal bed mobility: Needs Assistance Bed Mobility: Rolling;Sidelying to Sit Rolling: Modified independent (Device/Increase time) Sidelying to sit: Supervision       General bed mobility comments: VC's for proper log roll technique. Pt's wife very involved with learning proper technique.   Transfers Overall transfer level: Needs assistance Equipment used: Rolling walker (2 wheeled) Transfers: Sit to/from Stand Sit to Stand: Supervision         General transfer comment: Close supervision for safety as pt powered-up to full standing position.   Ambulation/Gait Ambulation/Gait assistance: Min guard;Supervision Ambulation Distance (Feet): 350 Feet Assistive device: Rolling  walker (2 wheeled) Gait Pattern/deviations: Step-through pattern;Decreased stride length;Trunk flexed;Staggering right;Staggering left Gait velocity: decreased Gait velocity interpretation: Below normal speed for age/gender General Gait Details: Slow but generally steady with RW. VC's for improved posture and general safety with RW management.    Stairs Stairs: Yes   Stair Management: One rail Right;Step to pattern;Forwards Number of Stairs: 4 General stair comments: HHA (min guard) on L with railing on R. Pt was able to negotiate stairs well with no physical assist required.   Wheelchair Mobility    Modified Rankin (Stroke Patients Only)       Balance Overall balance assessment: Needs assistance Sitting-balance support: Feet supported;No upper extremity supported Sitting balance-Leahy Scale: Fair     Standing balance support: During functional activity;Bilateral upper extremity supported Standing balance-Leahy Scale: Fair Standing balance comment: Reliant on BUE for support in standing and external support due to BLE weakness.                             Cognition Arousal/Alertness: Awake/alert Behavior During Therapy: WFL for tasks assessed/performed Overall Cognitive Status: Within Functional Limits for tasks assessed                                        Exercises      General Comments        Pertinent Vitals/Pain Pain Assessment: Faces Faces Pain Scale: Hurts a little bit Pain Location: back and right side Pain Descriptors / Indicators: Operative site guarding;Sore Pain Intervention(s): Monitored during session    Home Living  Prior Function            PT Goals (current goals can now be found in the care plan section) Acute Rehab PT Goals Patient Stated Goal: To return to PLOF PT Goal Formulation: With patient/family Time For Goal Achievement: 01/19/17 Potential to Achieve Goals:  Fair Progress towards PT goals: Progressing toward goals    Frequency    Min 5X/week      PT Plan Current plan remains appropriate    Co-evaluation              AM-PAC PT "6 Clicks" Daily Activity  Outcome Measure  Difficulty turning over in bed (including adjusting bedclothes, sheets and blankets)?: A Little Difficulty moving from lying on back to sitting on the side of the bed? : A Little Difficulty sitting down on and standing up from a chair with arms (e.g., wheelchair, bedside commode, etc,.)?: A Little Help needed moving to and from a bed to chair (including a wheelchair)?: A Little Help needed walking in hospital room?: A Little Help needed climbing 3-5 steps with a railing? : A Little 6 Click Score: 18    End of Session Equipment Utilized During Treatment: Gait belt Activity Tolerance: Patient limited by lethargy Patient left: in chair;with call bell/phone within reach;with family/visitor present Nurse Communication: Mobility status PT Visit Diagnosis: Unsteadiness on feet (R26.81);Muscle weakness (generalized) (M62.81)     Time: 4920-1007 PT Time Calculation (min) (ACUTE ONLY): 21 min  Charges:  $Gait Training: 8-22 mins                    G Codes:       Rolinda Roan, PT, DPT Acute Rehabilitation Services Pager: [phone]    Thelma Comp 01/07/2017, 9:34 AM

## 2017-01-07 NOTE — Op Note (Signed)
01/05/2017  3:33 PM  PATIENT:  Jeremiah Ray  71 y.o. male  PRE-OPERATIVE DIAGNOSIS:  Lumbosacral spondylolisthesis L4-5; lumbar stenosis L3-4, L4-5; lumbosacral spondylosis with radiculopathy  POST-OPERATIVE DIAGNOSIS:  Same  PROCEDURE:  L3-5 anterior interbody fusion via transpsoas approach; L3-5 laminectomy for decompression with L4-5 Gill procedure; L3-5 posterior segmental instrumented fusion with cortical trajectory screws; use of BMP  SURGEON:  Aldean Ast, MD  ASSISTANTS: Newman Pies, MD  ANESTHESIA:   General  DRAINS: Medium hemovac   SPECIMEN:  None  INDICATION FOR PROCEDURE: 70 year old man with back and leg pain not improved by aggressive medical management.  I have recommended the above operation.  The patient understood the risks, benefits, and alternatives and potential outcomes and wished to proceed.  PROCEDURE DETAILS: The patient was brought to the operating room.  After smooth induction of general endotracheal anesthesia the patient was placed in the lateral position with the right side up and secured with tape.  Localizing views were taken with fluoroscopy.  The operative site was then prepped and draped in the usual sterile fashion.  The planned incisions were infiltrated with lidocaine with epinephrine.   The skin was sharply incised and soft tissue to the level of the oblique abdominal muscles was carried out with monopolar cautery.  I then bluntly dissected through the oblique muscles with care taken to preserve any nerves.  I encountered encountered transversalis fascia which was bluntly entered and this opened into an easily dissected fat plane consistent with the retroperitoneum.  I inserted the dilator and approached the posterior third of the L4-5 disc space.  I confirmed that I was not in contact with any nerves of the lumbar plexus. A K-wire was passed into the disc space.  The tract was sequentially dilated and then the retractor was introduced.   I incised the disc space and then passed a Cobb elevator along each endplate and penetrated the annulus on the contralateral side.  I then used box cutters, a paddle, and a rasp to complete the discectomy.  A trial spacer was used to determine appropriate graft size and then a PEEK lordotic interbody graft packed with BMP and grafton was inserted.  The retractor was removed after hemostasis was confirmed.   The retractor was then repositioned to L3-4.  Discectomy was again performed.  A second interbody graft packed with BMP and grafton was inserted.  Hemostasis was again confirmed and the retractor was removed.     The incision was closed in layers with interrupted vicryl sutures.  The skin was closed with running monocryl stratafix sutures and dermabond.  The patient was turned prone on the Aguilar table. The skin of the lumbar area was clipped of hair and wiped out with alcohol. It was prepped and draped in the usual sterile fashion. The planned incision was injected with a mixture of lidocaine and Marcaine with epinephrine.  The skin was opened sharply and a subperiosteal dissection was performed to expose the lateral edges of the lamina of L3-5.  Subperiosteal dissection was performed over the L3-4 and L4-5 facet joints bilaterally.    The [facility] robotic system was registered to the patient using fluoroscopy.  Using the [facility] as a drill guide cortical trajectory screw tracts were then drilled at L3, L4, and L5 bilaterally.  K-Wires were placed down the trajectories and I then tapped over the K-wires.  K-wires were removed and the trajectories were palpated with a ball tip probe and found to be competent.  I then resected the spinous processes of inferior L3, L4, and superior L5.  I used the high speed drill to drill a trough across the lamina and pars at L3 and L4.  I then used an osteotome to fracture the bone across the pars and separate the inferior articular process at each level.  This was  separated from the underlying dura and ligament and removed from the surgical field.  The underlying ligament was elevated and resected.  Decompression was carried out to the lateral recesses with Kerrison rongeurs.  The superior edge of the exposed superior articular processes was resected to expose underlying annulus.  The thecal sac and nerve roots was separated from the posterior vertebral body wall and annulus where interbody fusion was planned.  Screws were then placed to the appropriate depth down the cannulated tracts.  A lordotic rod was inserted and secured in the screw caps. Final tightening with a torque wrench was performed at all levels. The remaining facet joints and transverse processes from L3 to L5 were decorticated with the high speed burr.  Fusion substrate was placed in the lateral gutters.  Meticulous hemostasis was obtained. The wound was irrigated with bacitracin saline. Exparel was injected into the paraspinous muscles.  A medium Hemovac drain was placed below the fascia. About 1 g of vancomycin powder was inserted into the wound. The wound was closed in routine anatomic layers. The skin was closed with a running subcuticular monocryl suture and then sealed with dermabond.   The patient was returned to the supine position and awoke without complication.  PATIENT DISPOSITION:  PACU - hemodynamically stable.   Delay start of Pharmacological VTE agent (>24hrs) due to surgical blood loss or risk of bleeding:  yes

## 2017-01-07 NOTE — Progress Notes (Signed)
Pt doing well. Pt and wife given D/C instructions with Rx's, verbal understanding was provided. Pt's incision is clean and dry with no sign of infection. Pt's IV and Hemovac were removed prior to D/C. Pt D/C'd home via wheelchair @ 1540 per MD order. Pt is stable @ D/C and has no other needs at this time. Holli Humbles, RN

## 2017-01-07 NOTE — Progress Notes (Signed)
Pt has voided independently with PVR less than 300cc. Pt will be D/C'd home with Flomax Rx. Holli Humbles, RN

## 2017-01-07 NOTE — Progress Notes (Signed)
Pt received RW and 3-N-1 from [place] prior to D/C. Holli Humbles, RN

## 2017-01-09 ENCOUNTER — Encounter (HOSPITAL_COMMUNITY): Payer: Self-pay | Admitting: Neurological Surgery

## 2017-01-09 DIAGNOSIS — N390 Urinary tract infection, site not specified: Secondary | ICD-10-CM | POA: Diagnosis not present

## 2017-01-10 DIAGNOSIS — Z87891 Personal history of nicotine dependence: Secondary | ICD-10-CM | POA: Diagnosis not present

## 2017-01-10 DIAGNOSIS — Z79891 Long term (current) use of opiate analgesic: Secondary | ICD-10-CM | POA: Diagnosis not present

## 2017-01-10 DIAGNOSIS — I10 Essential (primary) hypertension: Secondary | ICD-10-CM | POA: Diagnosis not present

## 2017-01-10 DIAGNOSIS — R338 Other retention of urine: Secondary | ICD-10-CM | POA: Diagnosis not present

## 2017-01-10 DIAGNOSIS — Z79899 Other long term (current) drug therapy: Secondary | ICD-10-CM | POA: Diagnosis not present

## 2017-01-10 DIAGNOSIS — N390 Urinary tract infection, site not specified: Secondary | ICD-10-CM | POA: Diagnosis not present

## 2017-01-10 DIAGNOSIS — Z7982 Long term (current) use of aspirin: Secondary | ICD-10-CM | POA: Diagnosis not present

## 2017-01-10 DIAGNOSIS — E119 Type 2 diabetes mellitus without complications: Secondary | ICD-10-CM | POA: Diagnosis not present

## 2017-01-10 DIAGNOSIS — N401 Enlarged prostate with lower urinary tract symptoms: Secondary | ICD-10-CM | POA: Diagnosis not present

## 2017-01-12 DIAGNOSIS — N390 Urinary tract infection, site not specified: Secondary | ICD-10-CM | POA: Diagnosis not present

## 2017-01-14 ENCOUNTER — Encounter: Payer: Self-pay | Admitting: *Deleted

## 2017-01-14 ENCOUNTER — Other Ambulatory Visit: Payer: Self-pay | Admitting: *Deleted

## 2017-01-14 NOTE — Patient Outreach (Signed)
[facility]) Care Management  01/14/2017  Jeremiah Ray Feb 16, 1946 [id number]  Referral via [place]; Patient discharged from [facility] 01/12/17: "TOC"  will be completed by primary care provider office who will refer to [facility] care management if needed.   Telephone call to patient who was advised of reason for call & [facility] care management services.   Patient voices that he was recently discharged from [facility] during hospital stay 10/12 thru 01/12/2017 for urinary problem (unable to pass urine, UTI). States previously had planned surgery on back (lumbar fusion) at [facility] (10/8-10/12/2016).  States he has discharge instructions from hospital & they are available to use if he needs to refer them.   States he is currently home with spouse who assists him as needed. Voices that he uses walker to get around home & has not has any falls. States he has not had any problem getting his medications & he is managing his own medicines by using pill box. States taking as instructed by MD.  Laurence Slate that pain medication is controlling pain.   States he has follow up appointments scheduled with primary care provider-Dr. Delena Bali and neurosurgeon-Dr. Ditty. States spouse plans to call to make appointment with [facility] for his follow up appointment.   Patients states he is aware for symptoms to report to MD office as it relates to post op complications and UTI. States he has MD phone numbers available if he needs to call.   States appetite is good & he is drinking plenty of water.   Voices no healthcare concerns at this time. States he does not need [facility] case management services (community or telephonic) at this time.  Patient consents to receive [facility] contact information.  Plan: Send [facility] contact information to patient. Send to care management assistant to close case out.   Sherrin Daisy, RN BSN [place] Management Coordinator [facility] Care Management   [phone]

## 2017-01-16 DIAGNOSIS — N401 Enlarged prostate with lower urinary tract symptoms: Secondary | ICD-10-CM | POA: Diagnosis not present

## 2017-01-16 DIAGNOSIS — Z79899 Other long term (current) drug therapy: Secondary | ICD-10-CM | POA: Diagnosis not present

## 2017-01-16 DIAGNOSIS — Z6832 Body mass index (BMI) 32.0-32.9, adult: Secondary | ICD-10-CM | POA: Diagnosis not present

## 2017-01-16 DIAGNOSIS — N390 Urinary tract infection, site not specified: Secondary | ICD-10-CM | POA: Diagnosis not present

## 2017-02-04 DIAGNOSIS — R262 Difficulty in walking, not elsewhere classified: Secondary | ICD-10-CM | POA: Diagnosis not present

## 2017-02-04 DIAGNOSIS — M545 Low back pain: Secondary | ICD-10-CM | POA: Diagnosis not present

## 2017-02-04 DIAGNOSIS — M6281 Muscle weakness (generalized): Secondary | ICD-10-CM | POA: Diagnosis not present

## 2017-02-04 DIAGNOSIS — M4326 Fusion of spine, lumbar region: Secondary | ICD-10-CM | POA: Diagnosis not present

## 2017-02-04 DIAGNOSIS — G4733 Obstructive sleep apnea (adult) (pediatric): Secondary | ICD-10-CM | POA: Diagnosis not present

## 2017-02-11 DIAGNOSIS — M4326 Fusion of spine, lumbar region: Secondary | ICD-10-CM | POA: Diagnosis not present

## 2017-02-11 DIAGNOSIS — M545 Low back pain: Secondary | ICD-10-CM | POA: Diagnosis not present

## 2017-02-11 DIAGNOSIS — R262 Difficulty in walking, not elsewhere classified: Secondary | ICD-10-CM | POA: Diagnosis not present

## 2017-02-11 DIAGNOSIS — M6281 Muscle weakness (generalized): Secondary | ICD-10-CM | POA: Diagnosis not present

## 2017-02-12 DIAGNOSIS — Z9181 History of falling: Secondary | ICD-10-CM | POA: Diagnosis not present

## 2017-02-12 DIAGNOSIS — Z139 Encounter for screening, unspecified: Secondary | ICD-10-CM | POA: Diagnosis not present

## 2017-02-12 DIAGNOSIS — Z125 Encounter for screening for malignant neoplasm of prostate: Secondary | ICD-10-CM | POA: Diagnosis not present

## 2017-02-12 DIAGNOSIS — E785 Hyperlipidemia, unspecified: Secondary | ICD-10-CM | POA: Diagnosis not present

## 2017-02-12 DIAGNOSIS — Z1331 Encounter for screening for depression: Secondary | ICD-10-CM | POA: Diagnosis not present

## 2017-02-12 DIAGNOSIS — Z136 Encounter for screening for cardiovascular disorders: Secondary | ICD-10-CM | POA: Diagnosis not present

## 2017-02-12 DIAGNOSIS — Z0000 Encounter for general adult medical examination without abnormal findings: Secondary | ICD-10-CM | POA: Diagnosis not present

## 2017-02-13 DIAGNOSIS — M545 Low back pain: Secondary | ICD-10-CM | POA: Diagnosis not present

## 2017-02-13 DIAGNOSIS — M4326 Fusion of spine, lumbar region: Secondary | ICD-10-CM | POA: Diagnosis not present

## 2017-02-13 DIAGNOSIS — M6281 Muscle weakness (generalized): Secondary | ICD-10-CM | POA: Diagnosis not present

## 2017-02-13 DIAGNOSIS — R262 Difficulty in walking, not elsewhere classified: Secondary | ICD-10-CM | POA: Diagnosis not present

## 2017-02-16 DIAGNOSIS — M4326 Fusion of spine, lumbar region: Secondary | ICD-10-CM | POA: Diagnosis not present

## 2017-02-16 DIAGNOSIS — R262 Difficulty in walking, not elsewhere classified: Secondary | ICD-10-CM | POA: Diagnosis not present

## 2017-02-16 DIAGNOSIS — M6281 Muscle weakness (generalized): Secondary | ICD-10-CM | POA: Diagnosis not present

## 2017-02-16 DIAGNOSIS — M545 Low back pain: Secondary | ICD-10-CM | POA: Diagnosis not present

## 2017-02-18 DIAGNOSIS — R262 Difficulty in walking, not elsewhere classified: Secondary | ICD-10-CM | POA: Diagnosis not present

## 2017-02-18 DIAGNOSIS — M545 Low back pain: Secondary | ICD-10-CM | POA: Diagnosis not present

## 2017-02-18 DIAGNOSIS — M4326 Fusion of spine, lumbar region: Secondary | ICD-10-CM | POA: Diagnosis not present

## 2017-02-18 DIAGNOSIS — M6281 Muscle weakness (generalized): Secondary | ICD-10-CM | POA: Diagnosis not present

## 2017-02-24 DIAGNOSIS — M6281 Muscle weakness (generalized): Secondary | ICD-10-CM | POA: Diagnosis not present

## 2017-02-24 DIAGNOSIS — M4326 Fusion of spine, lumbar region: Secondary | ICD-10-CM | POA: Diagnosis not present

## 2017-02-24 DIAGNOSIS — R262 Difficulty in walking, not elsewhere classified: Secondary | ICD-10-CM | POA: Diagnosis not present

## 2017-02-24 DIAGNOSIS — M545 Low back pain: Secondary | ICD-10-CM | POA: Diagnosis not present

## 2017-02-24 DIAGNOSIS — R8290 Unspecified abnormal findings in urine: Secondary | ICD-10-CM | POA: Diagnosis not present

## 2017-02-26 DIAGNOSIS — R262 Difficulty in walking, not elsewhere classified: Secondary | ICD-10-CM | POA: Diagnosis not present

## 2017-02-26 DIAGNOSIS — M545 Low back pain: Secondary | ICD-10-CM | POA: Diagnosis not present

## 2017-02-26 DIAGNOSIS — M6281 Muscle weakness (generalized): Secondary | ICD-10-CM | POA: Diagnosis not present

## 2017-02-26 DIAGNOSIS — M4326 Fusion of spine, lumbar region: Secondary | ICD-10-CM | POA: Diagnosis not present

## 2017-03-02 DIAGNOSIS — R262 Difficulty in walking, not elsewhere classified: Secondary | ICD-10-CM | POA: Diagnosis not present

## 2017-03-02 DIAGNOSIS — M545 Low back pain: Secondary | ICD-10-CM | POA: Diagnosis not present

## 2017-03-02 DIAGNOSIS — M6281 Muscle weakness (generalized): Secondary | ICD-10-CM | POA: Diagnosis not present

## 2017-03-02 DIAGNOSIS — M4326 Fusion of spine, lumbar region: Secondary | ICD-10-CM | POA: Diagnosis not present

## 2017-03-03 DIAGNOSIS — E785 Hyperlipidemia, unspecified: Secondary | ICD-10-CM | POA: Diagnosis not present

## 2017-03-03 DIAGNOSIS — E1165 Type 2 diabetes mellitus with hyperglycemia: Secondary | ICD-10-CM | POA: Diagnosis not present

## 2017-03-03 DIAGNOSIS — Z6830 Body mass index (BMI) 30.0-30.9, adult: Secondary | ICD-10-CM | POA: Diagnosis not present

## 2017-03-03 DIAGNOSIS — Z125 Encounter for screening for malignant neoplasm of prostate: Secondary | ICD-10-CM | POA: Diagnosis not present

## 2017-03-03 DIAGNOSIS — Z139 Encounter for screening, unspecified: Secondary | ICD-10-CM | POA: Diagnosis not present

## 2017-03-03 DIAGNOSIS — I10 Essential (primary) hypertension: Secondary | ICD-10-CM | POA: Diagnosis not present

## 2017-03-04 DIAGNOSIS — M545 Low back pain: Secondary | ICD-10-CM | POA: Diagnosis not present

## 2017-03-04 DIAGNOSIS — R262 Difficulty in walking, not elsewhere classified: Secondary | ICD-10-CM | POA: Diagnosis not present

## 2017-03-04 DIAGNOSIS — M6281 Muscle weakness (generalized): Secondary | ICD-10-CM | POA: Diagnosis not present

## 2017-03-04 DIAGNOSIS — M4326 Fusion of spine, lumbar region: Secondary | ICD-10-CM | POA: Diagnosis not present

## 2017-03-11 DIAGNOSIS — R262 Difficulty in walking, not elsewhere classified: Secondary | ICD-10-CM | POA: Diagnosis not present

## 2017-03-11 DIAGNOSIS — M545 Low back pain: Secondary | ICD-10-CM | POA: Diagnosis not present

## 2017-03-11 DIAGNOSIS — M6281 Muscle weakness (generalized): Secondary | ICD-10-CM | POA: Diagnosis not present

## 2017-03-11 DIAGNOSIS — M4326 Fusion of spine, lumbar region: Secondary | ICD-10-CM | POA: Diagnosis not present

## 2017-03-13 DIAGNOSIS — M4326 Fusion of spine, lumbar region: Secondary | ICD-10-CM | POA: Diagnosis not present

## 2017-03-13 DIAGNOSIS — R262 Difficulty in walking, not elsewhere classified: Secondary | ICD-10-CM | POA: Diagnosis not present

## 2017-03-13 DIAGNOSIS — M545 Low back pain: Secondary | ICD-10-CM | POA: Diagnosis not present

## 2017-03-13 DIAGNOSIS — M6281 Muscle weakness (generalized): Secondary | ICD-10-CM | POA: Diagnosis not present

## 2017-03-17 DIAGNOSIS — R262 Difficulty in walking, not elsewhere classified: Secondary | ICD-10-CM | POA: Diagnosis not present

## 2017-03-17 DIAGNOSIS — M545 Low back pain: Secondary | ICD-10-CM | POA: Diagnosis not present

## 2017-03-17 DIAGNOSIS — M4326 Fusion of spine, lumbar region: Secondary | ICD-10-CM | POA: Diagnosis not present

## 2017-03-17 DIAGNOSIS — M6281 Muscle weakness (generalized): Secondary | ICD-10-CM | POA: Diagnosis not present

## 2017-03-18 DIAGNOSIS — M4727 Other spondylosis with radiculopathy, lumbosacral region: Secondary | ICD-10-CM | POA: Diagnosis not present

## 2017-03-26 DIAGNOSIS — M4326 Fusion of spine, lumbar region: Secondary | ICD-10-CM | POA: Diagnosis not present

## 2017-03-26 DIAGNOSIS — M545 Low back pain: Secondary | ICD-10-CM | POA: Diagnosis not present

## 2017-03-26 DIAGNOSIS — M6281 Muscle weakness (generalized): Secondary | ICD-10-CM | POA: Diagnosis not present

## 2017-03-26 DIAGNOSIS — R262 Difficulty in walking, not elsewhere classified: Secondary | ICD-10-CM | POA: Diagnosis not present

## 2017-03-30 DIAGNOSIS — M4326 Fusion of spine, lumbar region: Secondary | ICD-10-CM | POA: Diagnosis not present

## 2017-03-30 DIAGNOSIS — M6281 Muscle weakness (generalized): Secondary | ICD-10-CM | POA: Diagnosis not present

## 2017-03-30 DIAGNOSIS — R262 Difficulty in walking, not elsewhere classified: Secondary | ICD-10-CM | POA: Diagnosis not present

## 2017-03-30 DIAGNOSIS — M545 Low back pain: Secondary | ICD-10-CM | POA: Diagnosis not present

## 2017-04-03 DIAGNOSIS — M545 Low back pain: Secondary | ICD-10-CM | POA: Diagnosis not present

## 2017-04-03 DIAGNOSIS — M6281 Muscle weakness (generalized): Secondary | ICD-10-CM | POA: Diagnosis not present

## 2017-04-03 DIAGNOSIS — R262 Difficulty in walking, not elsewhere classified: Secondary | ICD-10-CM | POA: Diagnosis not present

## 2017-04-03 DIAGNOSIS — M4326 Fusion of spine, lumbar region: Secondary | ICD-10-CM | POA: Diagnosis not present

## 2017-04-06 DIAGNOSIS — M4326 Fusion of spine, lumbar region: Secondary | ICD-10-CM | POA: Diagnosis not present

## 2017-04-06 DIAGNOSIS — M6281 Muscle weakness (generalized): Secondary | ICD-10-CM | POA: Diagnosis not present

## 2017-04-06 DIAGNOSIS — M545 Low back pain: Secondary | ICD-10-CM | POA: Diagnosis not present

## 2017-04-06 DIAGNOSIS — R262 Difficulty in walking, not elsewhere classified: Secondary | ICD-10-CM | POA: Diagnosis not present

## 2017-04-08 DIAGNOSIS — M545 Low back pain: Secondary | ICD-10-CM | POA: Diagnosis not present

## 2017-04-08 DIAGNOSIS — R262 Difficulty in walking, not elsewhere classified: Secondary | ICD-10-CM | POA: Diagnosis not present

## 2017-04-08 DIAGNOSIS — M6281 Muscle weakness (generalized): Secondary | ICD-10-CM | POA: Diagnosis not present

## 2017-04-08 DIAGNOSIS — M4326 Fusion of spine, lumbar region: Secondary | ICD-10-CM | POA: Diagnosis not present

## 2017-04-13 DIAGNOSIS — R262 Difficulty in walking, not elsewhere classified: Secondary | ICD-10-CM | POA: Diagnosis not present

## 2017-04-13 DIAGNOSIS — M4326 Fusion of spine, lumbar region: Secondary | ICD-10-CM | POA: Diagnosis not present

## 2017-04-13 DIAGNOSIS — M545 Low back pain: Secondary | ICD-10-CM | POA: Diagnosis not present

## 2017-04-13 DIAGNOSIS — M6281 Muscle weakness (generalized): Secondary | ICD-10-CM | POA: Diagnosis not present

## 2017-04-15 DIAGNOSIS — M6281 Muscle weakness (generalized): Secondary | ICD-10-CM | POA: Diagnosis not present

## 2017-04-15 DIAGNOSIS — M4326 Fusion of spine, lumbar region: Secondary | ICD-10-CM | POA: Diagnosis not present

## 2017-04-15 DIAGNOSIS — M545 Low back pain: Secondary | ICD-10-CM | POA: Diagnosis not present

## 2017-04-15 DIAGNOSIS — R262 Difficulty in walking, not elsewhere classified: Secondary | ICD-10-CM | POA: Diagnosis not present

## 2017-04-20 DIAGNOSIS — R262 Difficulty in walking, not elsewhere classified: Secondary | ICD-10-CM | POA: Diagnosis not present

## 2017-04-20 DIAGNOSIS — M6281 Muscle weakness (generalized): Secondary | ICD-10-CM | POA: Diagnosis not present

## 2017-04-20 DIAGNOSIS — M545 Low back pain: Secondary | ICD-10-CM | POA: Diagnosis not present

## 2017-04-20 DIAGNOSIS — M4326 Fusion of spine, lumbar region: Secondary | ICD-10-CM | POA: Diagnosis not present

## 2017-04-22 DIAGNOSIS — M4326 Fusion of spine, lumbar region: Secondary | ICD-10-CM | POA: Diagnosis not present

## 2017-04-22 DIAGNOSIS — R262 Difficulty in walking, not elsewhere classified: Secondary | ICD-10-CM | POA: Diagnosis not present

## 2017-04-22 DIAGNOSIS — M545 Low back pain: Secondary | ICD-10-CM | POA: Diagnosis not present

## 2017-04-22 DIAGNOSIS — M6281 Muscle weakness (generalized): Secondary | ICD-10-CM | POA: Diagnosis not present

## 2017-04-27 DIAGNOSIS — M545 Low back pain: Secondary | ICD-10-CM | POA: Diagnosis not present

## 2017-04-27 DIAGNOSIS — M4326 Fusion of spine, lumbar region: Secondary | ICD-10-CM | POA: Diagnosis not present

## 2017-04-27 DIAGNOSIS — M6281 Muscle weakness (generalized): Secondary | ICD-10-CM | POA: Diagnosis not present

## 2017-04-27 DIAGNOSIS — R262 Difficulty in walking, not elsewhere classified: Secondary | ICD-10-CM | POA: Diagnosis not present

## 2017-04-29 DIAGNOSIS — M4326 Fusion of spine, lumbar region: Secondary | ICD-10-CM | POA: Diagnosis not present

## 2017-04-29 DIAGNOSIS — M545 Low back pain: Secondary | ICD-10-CM | POA: Diagnosis not present

## 2017-04-29 DIAGNOSIS — M6281 Muscle weakness (generalized): Secondary | ICD-10-CM | POA: Diagnosis not present

## 2017-04-29 DIAGNOSIS — R262 Difficulty in walking, not elsewhere classified: Secondary | ICD-10-CM | POA: Diagnosis not present

## 2017-05-04 DIAGNOSIS — M6281 Muscle weakness (generalized): Secondary | ICD-10-CM | POA: Diagnosis not present

## 2017-05-04 DIAGNOSIS — R262 Difficulty in walking, not elsewhere classified: Secondary | ICD-10-CM | POA: Diagnosis not present

## 2017-05-04 DIAGNOSIS — M4326 Fusion of spine, lumbar region: Secondary | ICD-10-CM | POA: Diagnosis not present

## 2017-05-04 DIAGNOSIS — M545 Low back pain: Secondary | ICD-10-CM | POA: Diagnosis not present

## 2017-05-06 DIAGNOSIS — M4326 Fusion of spine, lumbar region: Secondary | ICD-10-CM | POA: Diagnosis not present

## 2017-05-06 DIAGNOSIS — R262 Difficulty in walking, not elsewhere classified: Secondary | ICD-10-CM | POA: Diagnosis not present

## 2017-05-06 DIAGNOSIS — M6281 Muscle weakness (generalized): Secondary | ICD-10-CM | POA: Diagnosis not present

## 2017-05-06 DIAGNOSIS — M545 Low back pain: Secondary | ICD-10-CM | POA: Diagnosis not present

## 2017-05-11 DIAGNOSIS — M4326 Fusion of spine, lumbar region: Secondary | ICD-10-CM | POA: Diagnosis not present

## 2017-05-11 DIAGNOSIS — M545 Low back pain: Secondary | ICD-10-CM | POA: Diagnosis not present

## 2017-05-11 DIAGNOSIS — M6281 Muscle weakness (generalized): Secondary | ICD-10-CM | POA: Diagnosis not present

## 2017-05-11 DIAGNOSIS — R262 Difficulty in walking, not elsewhere classified: Secondary | ICD-10-CM | POA: Diagnosis not present

## 2017-05-13 DIAGNOSIS — M545 Low back pain: Secondary | ICD-10-CM | POA: Diagnosis not present

## 2017-05-13 DIAGNOSIS — M4326 Fusion of spine, lumbar region: Secondary | ICD-10-CM | POA: Diagnosis not present

## 2017-05-13 DIAGNOSIS — R262 Difficulty in walking, not elsewhere classified: Secondary | ICD-10-CM | POA: Diagnosis not present

## 2017-05-13 DIAGNOSIS — M6281 Muscle weakness (generalized): Secondary | ICD-10-CM | POA: Diagnosis not present

## 2017-05-14 DIAGNOSIS — L570 Actinic keratosis: Secondary | ICD-10-CM | POA: Diagnosis not present

## 2017-05-14 DIAGNOSIS — G4733 Obstructive sleep apnea (adult) (pediatric): Secondary | ICD-10-CM | POA: Diagnosis not present

## 2017-05-18 DIAGNOSIS — M4326 Fusion of spine, lumbar region: Secondary | ICD-10-CM | POA: Diagnosis not present

## 2017-05-18 DIAGNOSIS — M6281 Muscle weakness (generalized): Secondary | ICD-10-CM | POA: Diagnosis not present

## 2017-05-18 DIAGNOSIS — R262 Difficulty in walking, not elsewhere classified: Secondary | ICD-10-CM | POA: Diagnosis not present

## 2017-05-18 DIAGNOSIS — M545 Low back pain: Secondary | ICD-10-CM | POA: Diagnosis not present

## 2017-05-20 DIAGNOSIS — M545 Low back pain: Secondary | ICD-10-CM | POA: Diagnosis not present

## 2017-05-20 DIAGNOSIS — M6281 Muscle weakness (generalized): Secondary | ICD-10-CM | POA: Diagnosis not present

## 2017-05-20 DIAGNOSIS — M4326 Fusion of spine, lumbar region: Secondary | ICD-10-CM | POA: Diagnosis not present

## 2017-05-20 DIAGNOSIS — R262 Difficulty in walking, not elsewhere classified: Secondary | ICD-10-CM | POA: Diagnosis not present

## 2017-05-25 DIAGNOSIS — M4326 Fusion of spine, lumbar region: Secondary | ICD-10-CM | POA: Diagnosis not present

## 2017-05-25 DIAGNOSIS — R262 Difficulty in walking, not elsewhere classified: Secondary | ICD-10-CM | POA: Diagnosis not present

## 2017-05-25 DIAGNOSIS — M545 Low back pain: Secondary | ICD-10-CM | POA: Diagnosis not present

## 2017-05-25 DIAGNOSIS — M6281 Muscle weakness (generalized): Secondary | ICD-10-CM | POA: Diagnosis not present

## 2017-05-27 DIAGNOSIS — M4326 Fusion of spine, lumbar region: Secondary | ICD-10-CM | POA: Diagnosis not present

## 2017-05-27 DIAGNOSIS — M6281 Muscle weakness (generalized): Secondary | ICD-10-CM | POA: Diagnosis not present

## 2017-05-27 DIAGNOSIS — R262 Difficulty in walking, not elsewhere classified: Secondary | ICD-10-CM | POA: Diagnosis not present

## 2017-05-27 DIAGNOSIS — M545 Low back pain: Secondary | ICD-10-CM | POA: Diagnosis not present

## 2017-06-04 DIAGNOSIS — I10 Essential (primary) hypertension: Secondary | ICD-10-CM | POA: Diagnosis not present

## 2017-06-04 DIAGNOSIS — M4317 Spondylolisthesis, lumbosacral region: Secondary | ICD-10-CM | POA: Diagnosis not present

## 2017-06-04 DIAGNOSIS — M7061 Trochanteric bursitis, right hip: Secondary | ICD-10-CM | POA: Diagnosis not present

## 2017-06-04 DIAGNOSIS — M4727 Other spondylosis with radiculopathy, lumbosacral region: Secondary | ICD-10-CM | POA: Diagnosis not present

## 2017-06-05 DIAGNOSIS — I10 Essential (primary) hypertension: Secondary | ICD-10-CM | POA: Diagnosis not present

## 2017-06-05 DIAGNOSIS — Z6831 Body mass index (BMI) 31.0-31.9, adult: Secondary | ICD-10-CM | POA: Diagnosis not present

## 2017-06-05 DIAGNOSIS — E785 Hyperlipidemia, unspecified: Secondary | ICD-10-CM | POA: Diagnosis not present

## 2017-06-05 DIAGNOSIS — E1165 Type 2 diabetes mellitus with hyperglycemia: Secondary | ICD-10-CM | POA: Diagnosis not present

## 2017-06-26 DIAGNOSIS — I10 Essential (primary) hypertension: Secondary | ICD-10-CM | POA: Diagnosis not present

## 2017-06-26 DIAGNOSIS — M7061 Trochanteric bursitis, right hip: Secondary | ICD-10-CM | POA: Diagnosis not present

## 2017-06-26 DIAGNOSIS — Z6832 Body mass index (BMI) 32.0-32.9, adult: Secondary | ICD-10-CM | POA: Diagnosis not present

## 2017-07-02 DIAGNOSIS — H524 Presbyopia: Secondary | ICD-10-CM | POA: Diagnosis not present

## 2017-07-02 DIAGNOSIS — E119 Type 2 diabetes mellitus without complications: Secondary | ICD-10-CM | POA: Diagnosis not present

## 2017-08-28 DIAGNOSIS — G4733 Obstructive sleep apnea (adult) (pediatric): Secondary | ICD-10-CM | POA: Diagnosis not present

## 2017-09-03 DIAGNOSIS — M5440 Lumbago with sciatica, unspecified side: Secondary | ICD-10-CM | POA: Diagnosis not present

## 2017-09-11 DIAGNOSIS — E1165 Type 2 diabetes mellitus with hyperglycemia: Secondary | ICD-10-CM | POA: Diagnosis not present

## 2017-09-11 DIAGNOSIS — E785 Hyperlipidemia, unspecified: Secondary | ICD-10-CM | POA: Diagnosis not present

## 2017-11-26 DIAGNOSIS — G4733 Obstructive sleep apnea (adult) (pediatric): Secondary | ICD-10-CM | POA: Diagnosis not present

## 2017-12-03 DIAGNOSIS — L570 Actinic keratosis: Secondary | ICD-10-CM | POA: Diagnosis not present

## 2017-12-24 DIAGNOSIS — Z23 Encounter for immunization: Secondary | ICD-10-CM | POA: Diagnosis not present

## 2017-12-24 DIAGNOSIS — I10 Essential (primary) hypertension: Secondary | ICD-10-CM | POA: Diagnosis not present

## 2017-12-24 DIAGNOSIS — E1165 Type 2 diabetes mellitus with hyperglycemia: Secondary | ICD-10-CM | POA: Diagnosis not present

## 2017-12-24 DIAGNOSIS — E785 Hyperlipidemia, unspecified: Secondary | ICD-10-CM | POA: Diagnosis not present

## 2017-12-24 DIAGNOSIS — Z6832 Body mass index (BMI) 32.0-32.9, adult: Secondary | ICD-10-CM | POA: Diagnosis not present

## 2018-01-12 DIAGNOSIS — Z6832 Body mass index (BMI) 32.0-32.9, adult: Secondary | ICD-10-CM | POA: Diagnosis not present

## 2018-01-12 DIAGNOSIS — H60391 Other infective otitis externa, right ear: Secondary | ICD-10-CM | POA: Diagnosis not present

## 2018-02-23 DIAGNOSIS — Z0000 Encounter for general adult medical examination without abnormal findings: Secondary | ICD-10-CM | POA: Diagnosis not present

## 2018-02-23 DIAGNOSIS — Z6833 Body mass index (BMI) 33.0-33.9, adult: Secondary | ICD-10-CM | POA: Diagnosis not present

## 2018-02-23 DIAGNOSIS — Z125 Encounter for screening for malignant neoplasm of prostate: Secondary | ICD-10-CM | POA: Diagnosis not present

## 2018-02-23 DIAGNOSIS — E785 Hyperlipidemia, unspecified: Secondary | ICD-10-CM | POA: Diagnosis not present

## 2018-02-23 DIAGNOSIS — Z9181 History of falling: Secondary | ICD-10-CM | POA: Diagnosis not present

## 2018-02-23 DIAGNOSIS — E669 Obesity, unspecified: Secondary | ICD-10-CM | POA: Diagnosis not present

## 2018-02-23 DIAGNOSIS — Z136 Encounter for screening for cardiovascular disorders: Secondary | ICD-10-CM | POA: Diagnosis not present

## 2018-02-23 DIAGNOSIS — Z139 Encounter for screening, unspecified: Secondary | ICD-10-CM | POA: Diagnosis not present

## 2018-02-24 DIAGNOSIS — G4733 Obstructive sleep apnea (adult) (pediatric): Secondary | ICD-10-CM | POA: Diagnosis not present

## 2018-04-06 DIAGNOSIS — E785 Hyperlipidemia, unspecified: Secondary | ICD-10-CM | POA: Diagnosis not present

## 2018-04-06 DIAGNOSIS — Z125 Encounter for screening for malignant neoplasm of prostate: Secondary | ICD-10-CM | POA: Diagnosis not present

## 2018-04-06 DIAGNOSIS — I10 Essential (primary) hypertension: Secondary | ICD-10-CM | POA: Diagnosis not present

## 2018-04-06 DIAGNOSIS — E1165 Type 2 diabetes mellitus with hyperglycemia: Secondary | ICD-10-CM | POA: Diagnosis not present

## 2018-04-06 DIAGNOSIS — Z6832 Body mass index (BMI) 32.0-32.9, adult: Secondary | ICD-10-CM | POA: Diagnosis not present

## 2018-04-12 DIAGNOSIS — E119 Type 2 diabetes mellitus without complications: Secondary | ICD-10-CM | POA: Diagnosis not present

## 2018-05-03 DIAGNOSIS — M25511 Pain in right shoulder: Secondary | ICD-10-CM | POA: Diagnosis not present

## 2018-05-06 DIAGNOSIS — G5601 Carpal tunnel syndrome, right upper limb: Secondary | ICD-10-CM | POA: Diagnosis not present

## 2018-05-06 DIAGNOSIS — M75101 Unspecified rotator cuff tear or rupture of right shoulder, not specified as traumatic: Secondary | ICD-10-CM | POA: Diagnosis not present

## 2018-05-11 DIAGNOSIS — G5603 Carpal tunnel syndrome, bilateral upper limbs: Secondary | ICD-10-CM | POA: Diagnosis not present

## 2018-05-19 DIAGNOSIS — G5603 Carpal tunnel syndrome, bilateral upper limbs: Secondary | ICD-10-CM | POA: Diagnosis not present

## 2018-05-26 DIAGNOSIS — G4733 Obstructive sleep apnea (adult) (pediatric): Secondary | ICD-10-CM | POA: Diagnosis not present

## 2018-06-11 DIAGNOSIS — E119 Type 2 diabetes mellitus without complications: Secondary | ICD-10-CM | POA: Diagnosis not present

## 2018-06-11 DIAGNOSIS — Z7982 Long term (current) use of aspirin: Secondary | ICD-10-CM | POA: Diagnosis not present

## 2018-06-11 DIAGNOSIS — I10 Essential (primary) hypertension: Secondary | ICD-10-CM | POA: Diagnosis not present

## 2018-06-11 DIAGNOSIS — Z87891 Personal history of nicotine dependence: Secondary | ICD-10-CM | POA: Diagnosis not present

## 2018-06-11 DIAGNOSIS — Z79899 Other long term (current) drug therapy: Secondary | ICD-10-CM | POA: Diagnosis not present

## 2018-06-11 DIAGNOSIS — Z86718 Personal history of other venous thrombosis and embolism: Secondary | ICD-10-CM | POA: Diagnosis not present

## 2018-06-11 DIAGNOSIS — N401 Enlarged prostate with lower urinary tract symptoms: Secondary | ICD-10-CM | POA: Diagnosis not present

## 2018-06-11 DIAGNOSIS — Z7984 Long term (current) use of oral hypoglycemic drugs: Secondary | ICD-10-CM | POA: Diagnosis not present

## 2018-06-11 DIAGNOSIS — G5603 Carpal tunnel syndrome, bilateral upper limbs: Secondary | ICD-10-CM | POA: Diagnosis not present

## 2018-06-17 DIAGNOSIS — L570 Actinic keratosis: Secondary | ICD-10-CM | POA: Diagnosis not present

## 2018-08-07 IMAGING — CT CT L SPINE W/O CM
3 of 5 series · 12 of 33 positions shown, 14 images · non-contrast
Comparison: Lumbar MRI 11/04/2016.

CLINICAL DATA: 71-year-old male with severe L4-L5 spinal stenosis
and large left L3-L4 subarticular disc herniation. Preoperative
study for surgical planning.

EXAM:
CT LUMBAR SPINE WITHOUT CONTRAST
TECHNIQUE: Multidetector CT imaging of the lumbar spine was performed without
intravenous contrast administration. Multiplanar CT image
reconstructions were also generated.

[Series 5: l spine soft · axial · 0.30mm/px · z∈[+760,+970]mm · 6 of 137 slices shown, 8 images]
[im 21/137  soft-tissue]
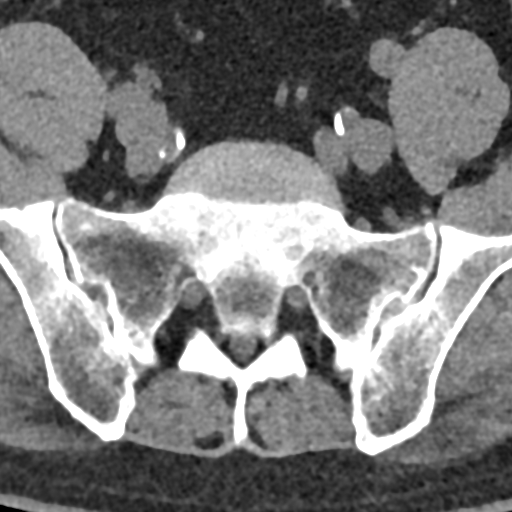
[im 21/137  bone]
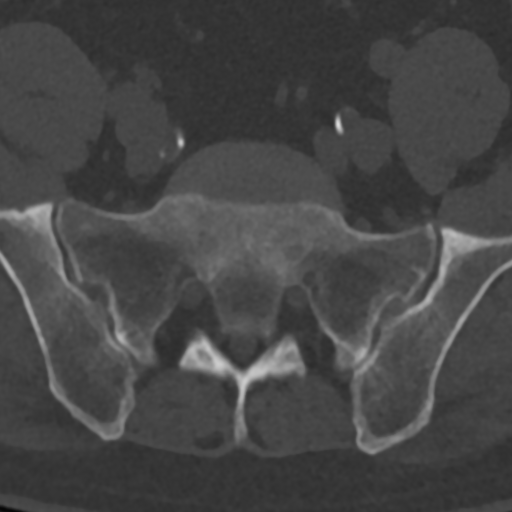
[im 42/137  bone]
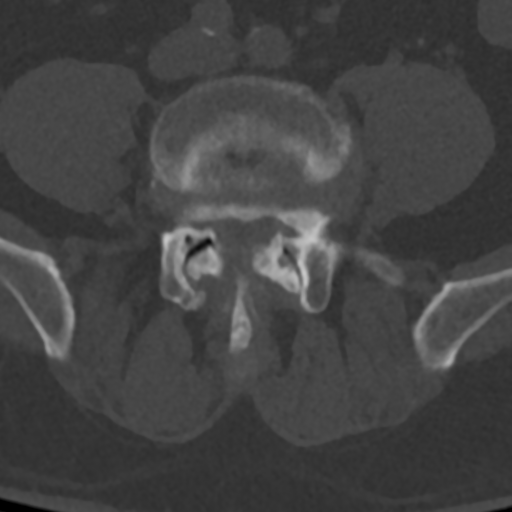
[im 63/137  bone]
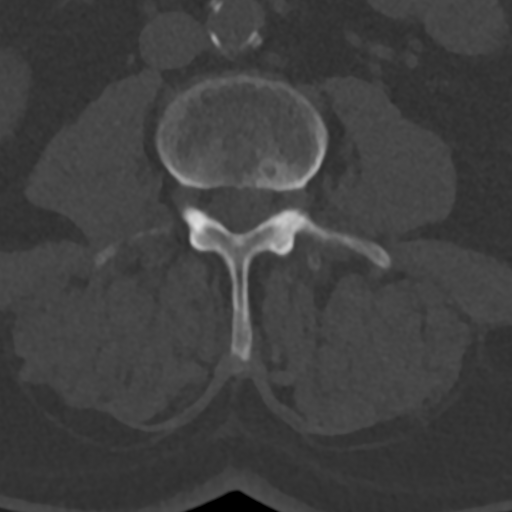
[im 84/137  bone]
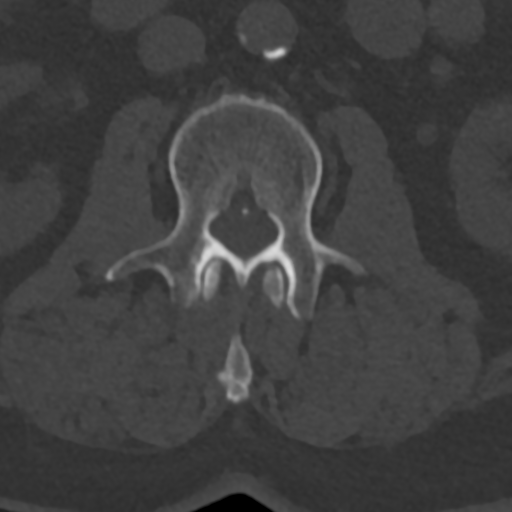
[im 105/137  soft-tissue]
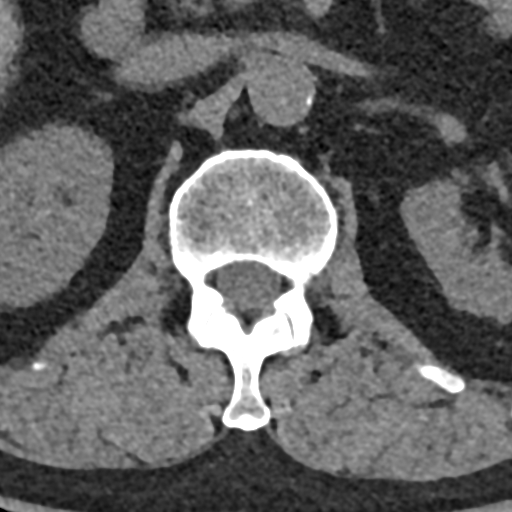
[im 105/137  bone]
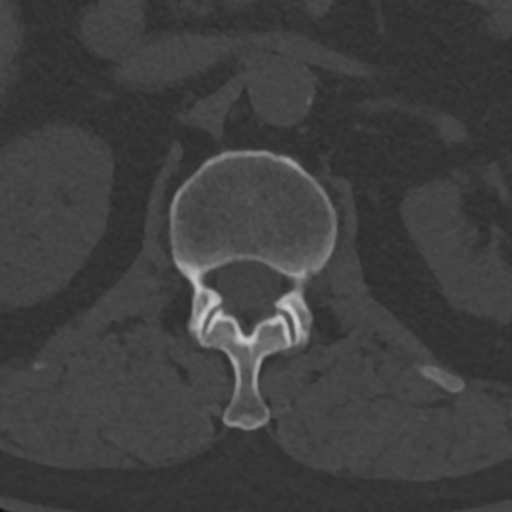
[im 126/137  bone]
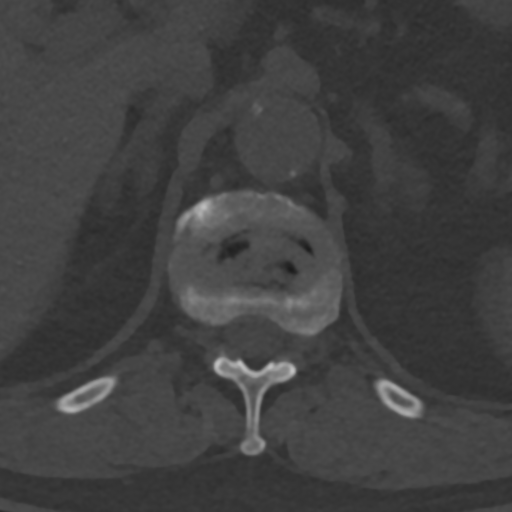

[Series 7: cor bone · coronal · 0.27mm/px · 1 of 68 slices shown]
[im 34/68  bone]
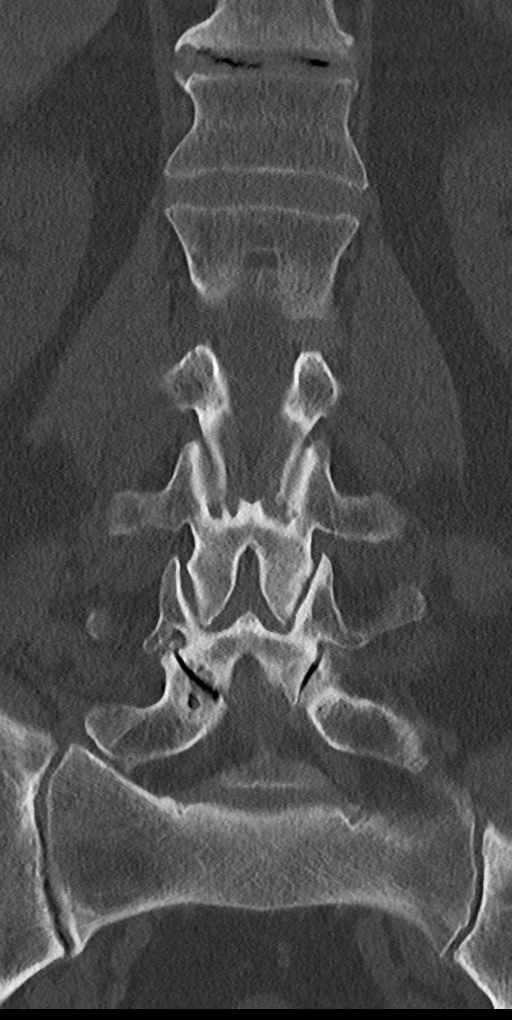

[Series 9: sag st · sagittal · 0.33mm/px · 5 of 70 slices shown]
[im 12/70  bone]
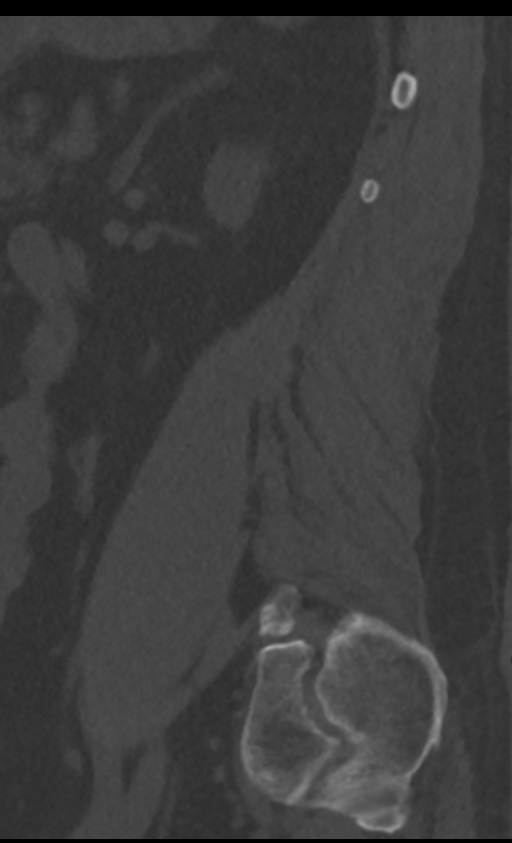
[im 24/70  bone]
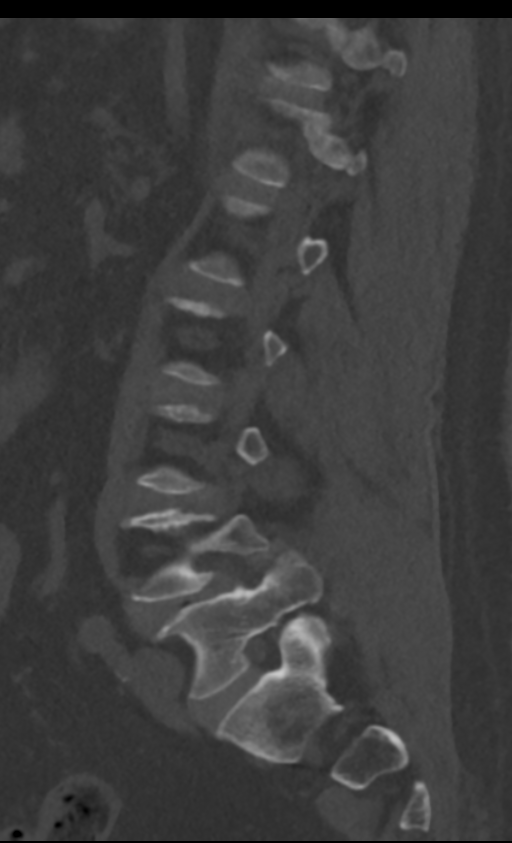
[im 35/70  bone]
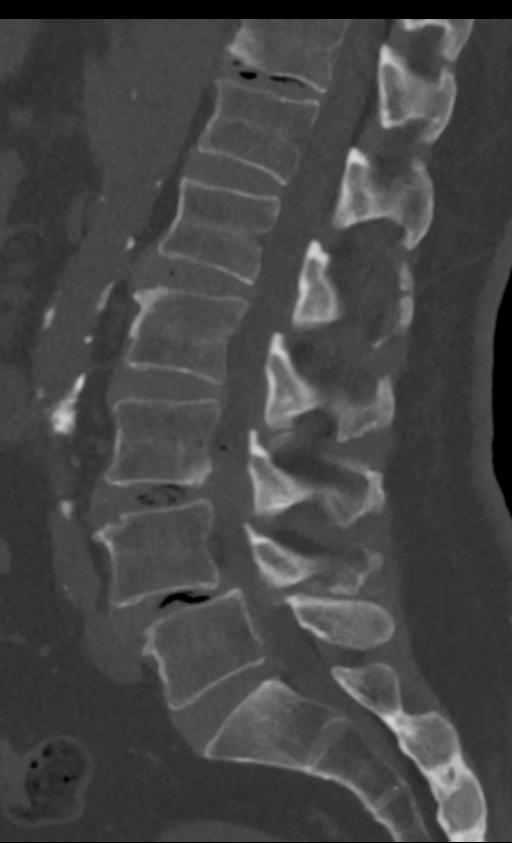
[im 47/70  bone]
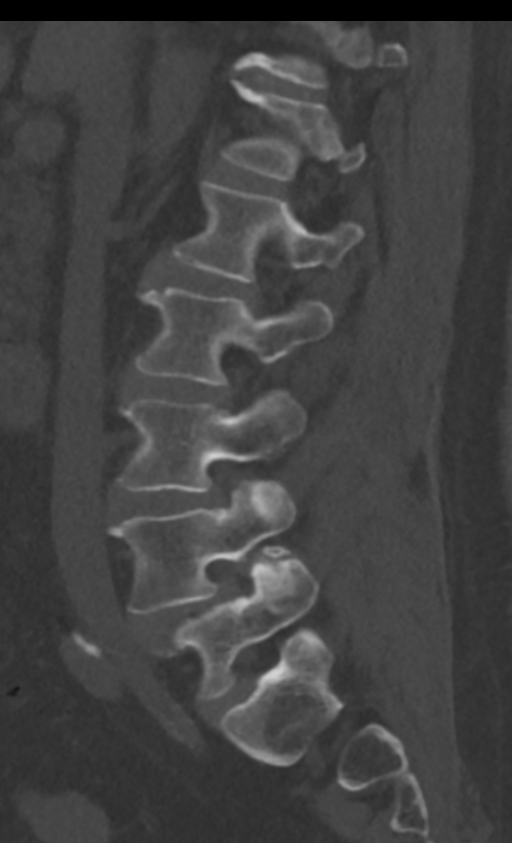
[im 58/70  bone]
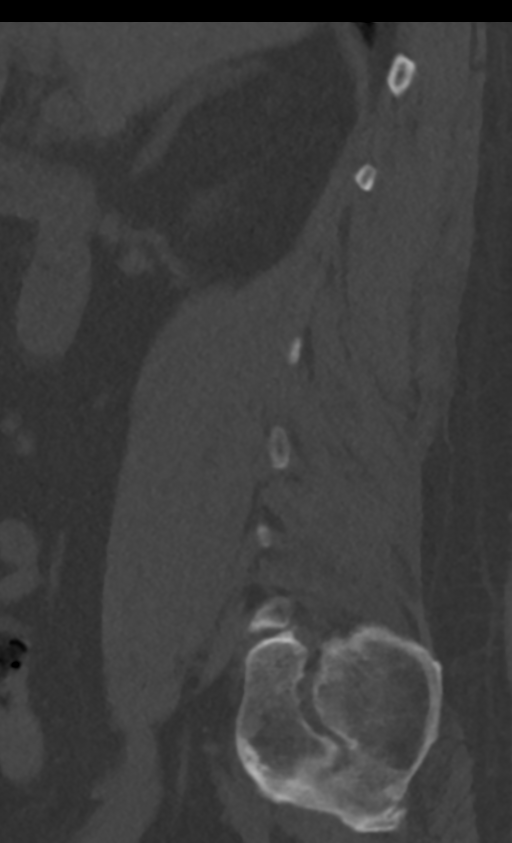

[12 of 33 positions shown; findings below may reference images not displayed]

FINDINGS: Segmentation: Normal, the same numbering system used on the
comparison MRI.

Alignment: Stable vertebral height and alignment. Grade 1
anterolisthesis of L4 on L5 measuring 6 mm. Subtle anterolisthesis
of L3 on L4. Subtle retrolisthesis of L5 on S1.

Vertebrae: Bone mineralization is within normal limits for age. No
acute osseous abnormality identified. Intact visible sacrum and SI
joints.

Paraspinal and other soft tissues: Aortoiliac calcified
atherosclerosis. Negative visualized noncontrast abdominal viscera.
Negative visualized posterior paraspinal soft tissues.

Disc levels:

T11-T12: Scattered vacuum disc. Mild disc bulge and endplate
spurring.

T12-L1:  Mild far lateral disc bulge and endplate spurring.

L1-L2: Trace vacuum disc. Mild circumferential, mostly far lateral,
disc bulge and endplate spurring. Mild facet and ligament flavum
hypertrophy. Borderline to mild left L1 neural foraminal stenosis.

L2-L3: Circumferential but mostly far lateral disc bulging,
eccentric to the left. Mild facet and ligament flavum hypertrophy.
Mild endplate spurring. Stable borderline to mild spinal and left
greater than right L2 neural foraminal stenosis.

L3-L4: Vacuum disc, and vacuum phenomena within the mostly cephalad
extruded disc material (series 5, image 73 and sagittal image 29).
Bulky left subarticular disc is evident at the disc space level on
series 5 image 78. Superimposed circumferential and far lateral disc
bulging and endplate spurring with moderate ligament flavum and
moderate to severe facet hypertrophy greater on the left. Trace left
vacuum facet. Thecal sac and left foraminal patency is suspected to
be stable.

L4-L5: Grade 1 anterolisthesis with disc space loss and vacuum disc.
Circumferential disc/ pseudo disc. Severe facet and ligament flavum
hypertrophy. Severe bilateral vacuum facet. Severe spinal stenosis
(series 5, image 95). Mild to moderate L4 foraminal stenosis appears
stable.

L5-S1:  Minimal disc bulge.  No stenosis.
IMPRESSION: 1. Study for preoperative planning. Stable vertebral height and
alignment since the [facility] MRI. No acute osseous abnormality.
2. Subtle anterolisthesis at L3-L4 with continued bulky left
subarticular disc extrusion. Note vacuum phenomena within the
extruded disc material evident at the mid L3 level on the left, and
vacuum phenomena within the parent disc. Moderate to severe facet
degeneration, greater on the left.
3. Grade 1 anterolisthesis at L4-L5 with vacuum disc and very severe
facet arthropathy with vacuum phenomena. Severe spinal stenosis.
4.  Aortic Atherosclerosis ([id number]).

## 2018-08-24 DIAGNOSIS — G4733 Obstructive sleep apnea (adult) (pediatric): Secondary | ICD-10-CM | POA: Diagnosis not present

## 2018-09-02 DIAGNOSIS — E785 Hyperlipidemia, unspecified: Secondary | ICD-10-CM | POA: Diagnosis not present

## 2018-09-02 DIAGNOSIS — I10 Essential (primary) hypertension: Secondary | ICD-10-CM | POA: Diagnosis not present

## 2018-09-02 DIAGNOSIS — Z6831 Body mass index (BMI) 31.0-31.9, adult: Secondary | ICD-10-CM | POA: Diagnosis not present

## 2018-09-02 DIAGNOSIS — E1169 Type 2 diabetes mellitus with other specified complication: Secondary | ICD-10-CM | POA: Diagnosis not present

## 2018-11-22 DIAGNOSIS — G4733 Obstructive sleep apnea (adult) (pediatric): Secondary | ICD-10-CM | POA: Diagnosis not present

## 2018-12-03 DIAGNOSIS — Z6831 Body mass index (BMI) 31.0-31.9, adult: Secondary | ICD-10-CM | POA: Diagnosis not present

## 2018-12-03 DIAGNOSIS — Z1331 Encounter for screening for depression: Secondary | ICD-10-CM | POA: Diagnosis not present

## 2018-12-03 DIAGNOSIS — E1169 Type 2 diabetes mellitus with other specified complication: Secondary | ICD-10-CM | POA: Diagnosis not present

## 2018-12-03 DIAGNOSIS — E785 Hyperlipidemia, unspecified: Secondary | ICD-10-CM | POA: Diagnosis not present

## 2018-12-03 DIAGNOSIS — I10 Essential (primary) hypertension: Secondary | ICD-10-CM | POA: Diagnosis not present

## 2018-12-23 DIAGNOSIS — D044 Carcinoma in situ of skin of scalp and neck: Secondary | ICD-10-CM | POA: Diagnosis not present

## 2018-12-23 DIAGNOSIS — L570 Actinic keratosis: Secondary | ICD-10-CM | POA: Diagnosis not present

## 2019-01-10 DIAGNOSIS — S4992XA Unspecified injury of left shoulder and upper arm, initial encounter: Secondary | ICD-10-CM | POA: Diagnosis not present

## 2019-01-10 DIAGNOSIS — M25512 Pain in left shoulder: Secondary | ICD-10-CM | POA: Diagnosis not present

## 2019-02-22 DIAGNOSIS — G4733 Obstructive sleep apnea (adult) (pediatric): Secondary | ICD-10-CM | POA: Diagnosis not present

## 2019-02-28 DIAGNOSIS — Z0000 Encounter for general adult medical examination without abnormal findings: Secondary | ICD-10-CM | POA: Diagnosis not present

## 2019-02-28 DIAGNOSIS — E785 Hyperlipidemia, unspecified: Secondary | ICD-10-CM | POA: Diagnosis not present

## 2019-02-28 DIAGNOSIS — E669 Obesity, unspecified: Secondary | ICD-10-CM | POA: Diagnosis not present

## 2019-02-28 DIAGNOSIS — Z125 Encounter for screening for malignant neoplasm of prostate: Secondary | ICD-10-CM | POA: Diagnosis not present

## 2019-02-28 DIAGNOSIS — Z136 Encounter for screening for cardiovascular disorders: Secondary | ICD-10-CM | POA: Diagnosis not present

## 2019-02-28 DIAGNOSIS — Z1331 Encounter for screening for depression: Secondary | ICD-10-CM | POA: Diagnosis not present

## 2019-02-28 DIAGNOSIS — Z139 Encounter for screening, unspecified: Secondary | ICD-10-CM | POA: Diagnosis not present

## 2019-02-28 DIAGNOSIS — Z6831 Body mass index (BMI) 31.0-31.9, adult: Secondary | ICD-10-CM | POA: Diagnosis not present

## 2019-02-28 DIAGNOSIS — Z9181 History of falling: Secondary | ICD-10-CM | POA: Diagnosis not present

## 2019-03-09 DIAGNOSIS — Z6832 Body mass index (BMI) 32.0-32.9, adult: Secondary | ICD-10-CM | POA: Diagnosis not present

## 2019-03-09 DIAGNOSIS — I10 Essential (primary) hypertension: Secondary | ICD-10-CM | POA: Diagnosis not present

## 2019-03-09 DIAGNOSIS — E785 Hyperlipidemia, unspecified: Secondary | ICD-10-CM | POA: Diagnosis not present

## 2019-03-09 DIAGNOSIS — Z23 Encounter for immunization: Secondary | ICD-10-CM | POA: Diagnosis not present

## 2019-03-09 DIAGNOSIS — E1169 Type 2 diabetes mellitus with other specified complication: Secondary | ICD-10-CM | POA: Diagnosis not present

## 2019-04-08 DIAGNOSIS — L918 Other hypertrophic disorders of the skin: Secondary | ICD-10-CM | POA: Diagnosis not present

## 2019-04-08 DIAGNOSIS — L570 Actinic keratosis: Secondary | ICD-10-CM | POA: Diagnosis not present

## 2019-04-19 DIAGNOSIS — E119 Type 2 diabetes mellitus without complications: Secondary | ICD-10-CM | POA: Diagnosis not present

## 2019-04-19 DIAGNOSIS — H524 Presbyopia: Secondary | ICD-10-CM | POA: Diagnosis not present

## 2019-06-09 DIAGNOSIS — Z6832 Body mass index (BMI) 32.0-32.9, adult: Secondary | ICD-10-CM | POA: Diagnosis not present

## 2019-06-09 DIAGNOSIS — Z125 Encounter for screening for malignant neoplasm of prostate: Secondary | ICD-10-CM | POA: Diagnosis not present

## 2019-06-09 DIAGNOSIS — E1169 Type 2 diabetes mellitus with other specified complication: Secondary | ICD-10-CM | POA: Diagnosis not present

## 2019-06-09 DIAGNOSIS — I10 Essential (primary) hypertension: Secondary | ICD-10-CM | POA: Diagnosis not present

## 2019-06-09 DIAGNOSIS — E785 Hyperlipidemia, unspecified: Secondary | ICD-10-CM | POA: Diagnosis not present

## 2019-06-09 DIAGNOSIS — G4733 Obstructive sleep apnea (adult) (pediatric): Secondary | ICD-10-CM | POA: Diagnosis not present

## 2019-06-21 DIAGNOSIS — D0439 Carcinoma in situ of skin of other parts of face: Secondary | ICD-10-CM | POA: Diagnosis not present

## 2019-06-21 DIAGNOSIS — L570 Actinic keratosis: Secondary | ICD-10-CM | POA: Diagnosis not present

## 2019-09-09 DIAGNOSIS — E785 Hyperlipidemia, unspecified: Secondary | ICD-10-CM | POA: Diagnosis not present

## 2019-09-09 DIAGNOSIS — R079 Chest pain, unspecified: Secondary | ICD-10-CM | POA: Diagnosis not present

## 2019-09-09 DIAGNOSIS — Z6831 Body mass index (BMI) 31.0-31.9, adult: Secondary | ICD-10-CM | POA: Diagnosis not present

## 2019-09-09 DIAGNOSIS — I10 Essential (primary) hypertension: Secondary | ICD-10-CM | POA: Diagnosis not present

## 2019-09-09 DIAGNOSIS — E1169 Type 2 diabetes mellitus with other specified complication: Secondary | ICD-10-CM | POA: Diagnosis not present

## 2019-09-17 DIAGNOSIS — J9601 Acute respiratory failure with hypoxia: Secondary | ICD-10-CM | POA: Diagnosis not present

## 2019-09-17 DIAGNOSIS — R4020 Unspecified coma: Secondary | ICD-10-CM | POA: Diagnosis not present

## 2019-09-17 DIAGNOSIS — I517 Cardiomegaly: Secondary | ICD-10-CM | POA: Diagnosis not present

## 2019-09-17 DIAGNOSIS — Z8674 Personal history of sudden cardiac arrest: Secondary | ICD-10-CM | POA: Diagnosis not present

## 2019-09-17 DIAGNOSIS — Z4682 Encounter for fitting and adjustment of non-vascular catheter: Secondary | ICD-10-CM | POA: Diagnosis not present

## 2019-09-17 DIAGNOSIS — I493 Ventricular premature depolarization: Secondary | ICD-10-CM | POA: Diagnosis not present

## 2019-09-17 DIAGNOSIS — I213 ST elevation (STEMI) myocardial infarction of unspecified site: Secondary | ICD-10-CM | POA: Diagnosis not present

## 2019-09-17 DIAGNOSIS — I462 Cardiac arrest due to underlying cardiac condition: Secondary | ICD-10-CM | POA: Diagnosis not present

## 2019-09-17 DIAGNOSIS — J9602 Acute respiratory failure with hypercapnia: Secondary | ICD-10-CM | POA: Diagnosis not present

## 2019-09-17 DIAGNOSIS — I5021 Acute systolic (congestive) heart failure: Secondary | ICD-10-CM | POA: Diagnosis not present

## 2019-09-17 DIAGNOSIS — I110 Hypertensive heart disease with heart failure: Secondary | ICD-10-CM | POA: Diagnosis not present

## 2019-09-17 DIAGNOSIS — Z9911 Dependence on respirator [ventilator] status: Secondary | ICD-10-CM | POA: Diagnosis not present

## 2019-09-17 DIAGNOSIS — I440 Atrioventricular block, first degree: Secondary | ICD-10-CM | POA: Diagnosis not present

## 2019-09-17 DIAGNOSIS — D72829 Elevated white blood cell count, unspecified: Secondary | ICD-10-CM | POA: Diagnosis not present

## 2019-09-17 DIAGNOSIS — I469 Cardiac arrest, cause unspecified: Secondary | ICD-10-CM | POA: Diagnosis not present

## 2019-09-17 DIAGNOSIS — Z20822 Contact with and (suspected) exposure to covid-19: Secondary | ICD-10-CM | POA: Diagnosis not present

## 2019-09-17 DIAGNOSIS — R0602 Shortness of breath: Secondary | ICD-10-CM | POA: Diagnosis not present

## 2019-09-17 DIAGNOSIS — I2510 Atherosclerotic heart disease of native coronary artery without angina pectoris: Secondary | ICD-10-CM | POA: Diagnosis not present

## 2019-09-17 DIAGNOSIS — R579 Shock, unspecified: Secondary | ICD-10-CM | POA: Diagnosis not present

## 2019-09-17 DIAGNOSIS — I878 Other specified disorders of veins: Secondary | ICD-10-CM | POA: Diagnosis not present

## 2019-09-17 DIAGNOSIS — I350 Nonrheumatic aortic (valve) stenosis: Secondary | ICD-10-CM | POA: Diagnosis not present

## 2019-09-17 DIAGNOSIS — I130 Hypertensive heart and chronic kidney disease with heart failure and stage 1 through stage 4 chronic kidney disease, or unspecified chronic kidney disease: Secondary | ICD-10-CM | POA: Diagnosis not present

## 2019-09-17 DIAGNOSIS — R404 Transient alteration of awareness: Secondary | ICD-10-CM | POA: Diagnosis not present

## 2019-09-17 DIAGNOSIS — E785 Hyperlipidemia, unspecified: Secondary | ICD-10-CM | POA: Diagnosis not present

## 2019-09-17 DIAGNOSIS — I498 Other specified cardiac arrhythmias: Secondary | ICD-10-CM | POA: Diagnosis not present

## 2019-09-17 DIAGNOSIS — D649 Anemia, unspecified: Secondary | ICD-10-CM | POA: Diagnosis not present

## 2019-09-17 DIAGNOSIS — I499 Cardiac arrhythmia, unspecified: Secondary | ICD-10-CM | POA: Diagnosis not present

## 2019-09-17 DIAGNOSIS — N182 Chronic kidney disease, stage 2 (mild): Secondary | ICD-10-CM | POA: Diagnosis not present

## 2019-09-17 DIAGNOSIS — J90 Pleural effusion, not elsewhere classified: Secondary | ICD-10-CM | POA: Diagnosis not present

## 2019-09-17 DIAGNOSIS — J9690 Respiratory failure, unspecified, unspecified whether with hypoxia or hypercapnia: Secondary | ICD-10-CM | POA: Diagnosis not present

## 2019-09-17 DIAGNOSIS — D631 Anemia in chronic kidney disease: Secondary | ICD-10-CM | POA: Diagnosis not present

## 2019-09-17 DIAGNOSIS — E119 Type 2 diabetes mellitus without complications: Secondary | ICD-10-CM | POA: Diagnosis not present

## 2019-09-17 DIAGNOSIS — R918 Other nonspecific abnormal finding of lung field: Secondary | ICD-10-CM | POA: Diagnosis not present

## 2019-09-17 DIAGNOSIS — I4901 Ventricular fibrillation: Secondary | ICD-10-CM | POA: Diagnosis not present

## 2019-09-17 DIAGNOSIS — I214 Non-ST elevation (NSTEMI) myocardial infarction: Secondary | ICD-10-CM | POA: Diagnosis not present

## 2019-09-17 DIAGNOSIS — I25118 Atherosclerotic heart disease of native coronary artery with other forms of angina pectoris: Secondary | ICD-10-CM | POA: Diagnosis not present

## 2019-09-17 DIAGNOSIS — I472 Ventricular tachycardia: Secondary | ICD-10-CM | POA: Diagnosis not present

## 2019-09-17 DIAGNOSIS — Z955 Presence of coronary angioplasty implant and graft: Secondary | ICD-10-CM | POA: Diagnosis not present

## 2019-09-17 DIAGNOSIS — I998 Other disorder of circulatory system: Secondary | ICD-10-CM | POA: Diagnosis not present

## 2019-09-21 DIAGNOSIS — I5021 Acute systolic (congestive) heart failure: Secondary | ICD-10-CM | POA: Insufficient documentation

## 2019-09-21 DIAGNOSIS — I25118 Atherosclerotic heart disease of native coronary artery with other forms of angina pectoris: Secondary | ICD-10-CM | POA: Insufficient documentation

## 2019-09-21 DIAGNOSIS — E119 Type 2 diabetes mellitus without complications: Secondary | ICD-10-CM | POA: Insufficient documentation

## 2019-09-21 HISTORY — DX: Acute systolic (congestive) heart failure: I50.21

## 2019-09-22 ENCOUNTER — Other Ambulatory Visit: Payer: Self-pay

## 2019-09-28 DIAGNOSIS — I472 Ventricular tachycardia: Secondary | ICD-10-CM | POA: Diagnosis not present

## 2019-09-28 DIAGNOSIS — E785 Hyperlipidemia, unspecified: Secondary | ICD-10-CM | POA: Diagnosis not present

## 2019-09-28 DIAGNOSIS — I214 Non-ST elevation (NSTEMI) myocardial infarction: Secondary | ICD-10-CM | POA: Diagnosis not present

## 2019-09-28 DIAGNOSIS — Z8674 Personal history of sudden cardiac arrest: Secondary | ICD-10-CM | POA: Diagnosis not present

## 2019-09-30 ENCOUNTER — Ambulatory Visit: Payer: Medicare HMO | Admitting: Cardiology

## 2019-10-04 DIAGNOSIS — I10 Essential (primary) hypertension: Secondary | ICD-10-CM | POA: Diagnosis not present

## 2019-10-04 DIAGNOSIS — E782 Mixed hyperlipidemia: Secondary | ICD-10-CM | POA: Diagnosis not present

## 2019-10-04 DIAGNOSIS — Z7982 Long term (current) use of aspirin: Secondary | ICD-10-CM | POA: Diagnosis not present

## 2019-10-04 DIAGNOSIS — I2510 Atherosclerotic heart disease of native coronary artery without angina pectoris: Secondary | ICD-10-CM | POA: Diagnosis not present

## 2019-10-21 DIAGNOSIS — R1314 Dysphagia, pharyngoesophageal phase: Secondary | ICD-10-CM | POA: Diagnosis not present

## 2019-10-21 DIAGNOSIS — R339 Retention of urine, unspecified: Secondary | ICD-10-CM | POA: Diagnosis not present

## 2019-10-21 DIAGNOSIS — Z9989 Dependence on other enabling machines and devices: Secondary | ICD-10-CM | POA: Diagnosis not present

## 2019-10-21 DIAGNOSIS — G4733 Obstructive sleep apnea (adult) (pediatric): Secondary | ICD-10-CM | POA: Diagnosis not present

## 2019-10-25 DIAGNOSIS — R1314 Dysphagia, pharyngoesophageal phase: Secondary | ICD-10-CM | POA: Diagnosis not present

## 2019-10-27 DIAGNOSIS — R1310 Dysphagia, unspecified: Secondary | ICD-10-CM | POA: Diagnosis not present

## 2019-11-01 DIAGNOSIS — Z7982 Long term (current) use of aspirin: Secondary | ICD-10-CM | POA: Diagnosis not present

## 2019-11-01 DIAGNOSIS — I350 Nonrheumatic aortic (valve) stenosis: Secondary | ICD-10-CM | POA: Diagnosis not present

## 2019-11-01 DIAGNOSIS — I10 Essential (primary) hypertension: Secondary | ICD-10-CM | POA: Diagnosis not present

## 2019-11-01 DIAGNOSIS — I2510 Atherosclerotic heart disease of native coronary artery without angina pectoris: Secondary | ICD-10-CM | POA: Diagnosis not present

## 2019-11-01 DIAGNOSIS — E782 Mixed hyperlipidemia: Secondary | ICD-10-CM | POA: Diagnosis not present

## 2019-11-02 DIAGNOSIS — R1310 Dysphagia, unspecified: Secondary | ICD-10-CM | POA: Diagnosis not present

## 2019-11-02 DIAGNOSIS — R1314 Dysphagia, pharyngoesophageal phase: Secondary | ICD-10-CM | POA: Diagnosis not present

## 2019-11-04 DIAGNOSIS — D500 Iron deficiency anemia secondary to blood loss (chronic): Secondary | ICD-10-CM | POA: Insufficient documentation

## 2019-11-08 DIAGNOSIS — I252 Old myocardial infarction: Secondary | ICD-10-CM | POA: Diagnosis not present

## 2019-11-08 DIAGNOSIS — R011 Cardiac murmur, unspecified: Secondary | ICD-10-CM | POA: Diagnosis not present

## 2019-11-08 DIAGNOSIS — E119 Type 2 diabetes mellitus without complications: Secondary | ICD-10-CM | POA: Diagnosis not present

## 2019-11-08 DIAGNOSIS — Z955 Presence of coronary angioplasty implant and graft: Secondary | ICD-10-CM | POA: Diagnosis not present

## 2019-11-08 DIAGNOSIS — Z7982 Long term (current) use of aspirin: Secondary | ICD-10-CM | POA: Diagnosis not present

## 2019-11-08 DIAGNOSIS — Z79899 Other long term (current) drug therapy: Secondary | ICD-10-CM | POA: Diagnosis not present

## 2019-11-08 DIAGNOSIS — I10 Essential (primary) hypertension: Secondary | ICD-10-CM | POA: Diagnosis not present

## 2019-11-09 DIAGNOSIS — Z7982 Long term (current) use of aspirin: Secondary | ICD-10-CM | POA: Diagnosis not present

## 2019-11-09 DIAGNOSIS — Z955 Presence of coronary angioplasty implant and graft: Secondary | ICD-10-CM | POA: Diagnosis not present

## 2019-11-09 DIAGNOSIS — R011 Cardiac murmur, unspecified: Secondary | ICD-10-CM | POA: Diagnosis not present

## 2019-11-09 DIAGNOSIS — I10 Essential (primary) hypertension: Secondary | ICD-10-CM | POA: Diagnosis not present

## 2019-11-09 DIAGNOSIS — I252 Old myocardial infarction: Secondary | ICD-10-CM | POA: Diagnosis not present

## 2019-11-09 DIAGNOSIS — Z79899 Other long term (current) drug therapy: Secondary | ICD-10-CM | POA: Diagnosis not present

## 2019-11-09 DIAGNOSIS — E119 Type 2 diabetes mellitus without complications: Secondary | ICD-10-CM | POA: Diagnosis not present

## 2019-11-11 DIAGNOSIS — I252 Old myocardial infarction: Secondary | ICD-10-CM | POA: Diagnosis not present

## 2019-11-11 DIAGNOSIS — Z7982 Long term (current) use of aspirin: Secondary | ICD-10-CM | POA: Diagnosis not present

## 2019-11-11 DIAGNOSIS — Z79899 Other long term (current) drug therapy: Secondary | ICD-10-CM | POA: Diagnosis not present

## 2019-11-11 DIAGNOSIS — I10 Essential (primary) hypertension: Secondary | ICD-10-CM | POA: Diagnosis not present

## 2019-11-11 DIAGNOSIS — E119 Type 2 diabetes mellitus without complications: Secondary | ICD-10-CM | POA: Diagnosis not present

## 2019-11-11 DIAGNOSIS — Z955 Presence of coronary angioplasty implant and graft: Secondary | ICD-10-CM | POA: Diagnosis not present

## 2019-11-11 DIAGNOSIS — R011 Cardiac murmur, unspecified: Secondary | ICD-10-CM | POA: Diagnosis not present

## 2019-11-14 DIAGNOSIS — R011 Cardiac murmur, unspecified: Secondary | ICD-10-CM | POA: Diagnosis not present

## 2019-11-14 DIAGNOSIS — Z79899 Other long term (current) drug therapy: Secondary | ICD-10-CM | POA: Diagnosis not present

## 2019-11-14 DIAGNOSIS — E119 Type 2 diabetes mellitus without complications: Secondary | ICD-10-CM | POA: Diagnosis not present

## 2019-11-14 DIAGNOSIS — I10 Essential (primary) hypertension: Secondary | ICD-10-CM | POA: Diagnosis not present

## 2019-11-14 DIAGNOSIS — Z955 Presence of coronary angioplasty implant and graft: Secondary | ICD-10-CM | POA: Diagnosis not present

## 2019-11-14 DIAGNOSIS — Z7982 Long term (current) use of aspirin: Secondary | ICD-10-CM | POA: Diagnosis not present

## 2019-11-14 DIAGNOSIS — I252 Old myocardial infarction: Secondary | ICD-10-CM | POA: Diagnosis not present

## 2019-11-16 DIAGNOSIS — K228 Other specified diseases of esophagus: Secondary | ICD-10-CM | POA: Diagnosis not present

## 2019-11-16 DIAGNOSIS — C160 Malignant neoplasm of cardia: Secondary | ICD-10-CM | POA: Diagnosis not present

## 2019-11-16 DIAGNOSIS — R1310 Dysphagia, unspecified: Secondary | ICD-10-CM | POA: Diagnosis not present

## 2019-11-17 DIAGNOSIS — C160 Malignant neoplasm of cardia: Secondary | ICD-10-CM | POA: Diagnosis not present

## 2019-11-18 DIAGNOSIS — Z7982 Long term (current) use of aspirin: Secondary | ICD-10-CM | POA: Diagnosis not present

## 2019-11-18 DIAGNOSIS — I10 Essential (primary) hypertension: Secondary | ICD-10-CM | POA: Diagnosis not present

## 2019-11-18 DIAGNOSIS — Z79899 Other long term (current) drug therapy: Secondary | ICD-10-CM | POA: Diagnosis not present

## 2019-11-18 DIAGNOSIS — Z955 Presence of coronary angioplasty implant and graft: Secondary | ICD-10-CM | POA: Diagnosis not present

## 2019-11-18 DIAGNOSIS — I252 Old myocardial infarction: Secondary | ICD-10-CM | POA: Diagnosis not present

## 2019-11-18 DIAGNOSIS — R011 Cardiac murmur, unspecified: Secondary | ICD-10-CM | POA: Diagnosis not present

## 2019-11-18 DIAGNOSIS — E119 Type 2 diabetes mellitus without complications: Secondary | ICD-10-CM | POA: Diagnosis not present

## 2019-11-21 DIAGNOSIS — R011 Cardiac murmur, unspecified: Secondary | ICD-10-CM | POA: Diagnosis not present

## 2019-11-21 DIAGNOSIS — Z7982 Long term (current) use of aspirin: Secondary | ICD-10-CM | POA: Diagnosis not present

## 2019-11-21 DIAGNOSIS — Z79899 Other long term (current) drug therapy: Secondary | ICD-10-CM | POA: Diagnosis not present

## 2019-11-21 DIAGNOSIS — I252 Old myocardial infarction: Secondary | ICD-10-CM | POA: Diagnosis not present

## 2019-11-21 DIAGNOSIS — I10 Essential (primary) hypertension: Secondary | ICD-10-CM | POA: Diagnosis not present

## 2019-11-21 DIAGNOSIS — Z955 Presence of coronary angioplasty implant and graft: Secondary | ICD-10-CM | POA: Diagnosis not present

## 2019-11-21 DIAGNOSIS — E119 Type 2 diabetes mellitus without complications: Secondary | ICD-10-CM | POA: Diagnosis not present

## 2019-11-23 DIAGNOSIS — I252 Old myocardial infarction: Secondary | ICD-10-CM | POA: Diagnosis not present

## 2019-11-23 DIAGNOSIS — Z79899 Other long term (current) drug therapy: Secondary | ICD-10-CM | POA: Diagnosis not present

## 2019-11-23 DIAGNOSIS — R011 Cardiac murmur, unspecified: Secondary | ICD-10-CM | POA: Diagnosis not present

## 2019-11-23 DIAGNOSIS — I10 Essential (primary) hypertension: Secondary | ICD-10-CM | POA: Diagnosis not present

## 2019-11-23 DIAGNOSIS — Z955 Presence of coronary angioplasty implant and graft: Secondary | ICD-10-CM | POA: Diagnosis not present

## 2019-11-23 DIAGNOSIS — Z7982 Long term (current) use of aspirin: Secondary | ICD-10-CM | POA: Diagnosis not present

## 2019-11-23 DIAGNOSIS — E119 Type 2 diabetes mellitus without complications: Secondary | ICD-10-CM | POA: Diagnosis not present

## 2019-11-24 DIAGNOSIS — C160 Malignant neoplasm of cardia: Secondary | ICD-10-CM | POA: Diagnosis not present

## 2019-11-24 DIAGNOSIS — J90 Pleural effusion, not elsewhere classified: Secondary | ICD-10-CM | POA: Diagnosis not present

## 2019-11-24 DIAGNOSIS — I700 Atherosclerosis of aorta: Secondary | ICD-10-CM | POA: Diagnosis not present

## 2019-11-24 DIAGNOSIS — N281 Cyst of kidney, acquired: Secondary | ICD-10-CM | POA: Diagnosis not present

## 2019-11-24 DIAGNOSIS — J9811 Atelectasis: Secondary | ICD-10-CM | POA: Diagnosis not present

## 2019-11-24 DIAGNOSIS — I2510 Atherosclerotic heart disease of native coronary artery without angina pectoris: Secondary | ICD-10-CM | POA: Diagnosis not present

## 2019-11-25 DIAGNOSIS — Z7982 Long term (current) use of aspirin: Secondary | ICD-10-CM | POA: Diagnosis not present

## 2019-11-25 DIAGNOSIS — I252 Old myocardial infarction: Secondary | ICD-10-CM | POA: Diagnosis not present

## 2019-11-25 DIAGNOSIS — E119 Type 2 diabetes mellitus without complications: Secondary | ICD-10-CM | POA: Diagnosis not present

## 2019-11-25 DIAGNOSIS — Z79899 Other long term (current) drug therapy: Secondary | ICD-10-CM | POA: Diagnosis not present

## 2019-11-25 DIAGNOSIS — Z955 Presence of coronary angioplasty implant and graft: Secondary | ICD-10-CM | POA: Diagnosis not present

## 2019-11-25 DIAGNOSIS — R011 Cardiac murmur, unspecified: Secondary | ICD-10-CM | POA: Diagnosis not present

## 2019-11-25 DIAGNOSIS — I10 Essential (primary) hypertension: Secondary | ICD-10-CM | POA: Diagnosis not present

## 2019-11-28 DIAGNOSIS — R970 Elevated carcinoembryonic antigen [CEA]: Secondary | ICD-10-CM | POA: Diagnosis not present

## 2019-11-28 DIAGNOSIS — Z7901 Long term (current) use of anticoagulants: Secondary | ICD-10-CM | POA: Diagnosis not present

## 2019-11-28 DIAGNOSIS — I252 Old myocardial infarction: Secondary | ICD-10-CM | POA: Diagnosis not present

## 2019-11-28 DIAGNOSIS — E538 Deficiency of other specified B group vitamins: Secondary | ICD-10-CM | POA: Diagnosis not present

## 2019-11-28 DIAGNOSIS — Z86718 Personal history of other venous thrombosis and embolism: Secondary | ICD-10-CM | POA: Diagnosis not present

## 2019-11-28 DIAGNOSIS — J90 Pleural effusion, not elsewhere classified: Secondary | ICD-10-CM | POA: Diagnosis not present

## 2019-11-28 DIAGNOSIS — C160 Malignant neoplasm of cardia: Secondary | ICD-10-CM | POA: Diagnosis not present

## 2019-11-28 LAB — CREATININE, SERUM: Creatinine: 0.9 (ref <=1.3)

## 2019-11-29 DIAGNOSIS — J90 Pleural effusion, not elsewhere classified: Secondary | ICD-10-CM | POA: Insufficient documentation

## 2019-11-29 DIAGNOSIS — C155 Malignant neoplasm of lower third of esophagus: Secondary | ICD-10-CM | POA: Diagnosis not present

## 2019-11-29 DIAGNOSIS — Z1159 Encounter for screening for other viral diseases: Secondary | ICD-10-CM | POA: Diagnosis not present

## 2019-11-29 DIAGNOSIS — G4733 Obstructive sleep apnea (adult) (pediatric): Secondary | ICD-10-CM | POA: Diagnosis not present

## 2019-11-30 DIAGNOSIS — J90 Pleural effusion, not elsewhere classified: Secondary | ICD-10-CM | POA: Diagnosis not present

## 2019-11-30 DIAGNOSIS — C155 Malignant neoplasm of lower third of esophagus: Secondary | ICD-10-CM | POA: Diagnosis not present

## 2019-11-30 DIAGNOSIS — Z9889 Other specified postprocedural states: Secondary | ICD-10-CM | POA: Diagnosis not present

## 2019-11-30 DIAGNOSIS — R846 Abnormal cytological findings in specimens from respiratory organs and thorax: Secondary | ICD-10-CM | POA: Diagnosis not present

## 2019-12-02 DIAGNOSIS — M1990 Unspecified osteoarthritis, unspecified site: Secondary | ICD-10-CM | POA: Diagnosis not present

## 2019-12-02 DIAGNOSIS — Z87891 Personal history of nicotine dependence: Secondary | ICD-10-CM | POA: Diagnosis not present

## 2019-12-02 DIAGNOSIS — E119 Type 2 diabetes mellitus without complications: Secondary | ICD-10-CM | POA: Diagnosis not present

## 2019-12-02 DIAGNOSIS — C155 Malignant neoplasm of lower third of esophagus: Secondary | ICD-10-CM | POA: Diagnosis not present

## 2019-12-02 DIAGNOSIS — J90 Pleural effusion, not elsewhere classified: Secondary | ICD-10-CM | POA: Diagnosis not present

## 2019-12-02 DIAGNOSIS — G4733 Obstructive sleep apnea (adult) (pediatric): Secondary | ICD-10-CM | POA: Diagnosis not present

## 2019-12-02 DIAGNOSIS — E7800 Pure hypercholesterolemia, unspecified: Secondary | ICD-10-CM | POA: Diagnosis not present

## 2019-12-02 DIAGNOSIS — I10 Essential (primary) hypertension: Secondary | ICD-10-CM | POA: Diagnosis not present

## 2019-12-02 DIAGNOSIS — Z7984 Long term (current) use of oral hypoglycemic drugs: Secondary | ICD-10-CM | POA: Diagnosis not present

## 2019-12-02 DIAGNOSIS — Z7982 Long term (current) use of aspirin: Secondary | ICD-10-CM | POA: Diagnosis not present

## 2019-12-08 DIAGNOSIS — I2510 Atherosclerotic heart disease of native coronary artery without angina pectoris: Secondary | ICD-10-CM | POA: Diagnosis not present

## 2019-12-08 DIAGNOSIS — I700 Atherosclerosis of aorta: Secondary | ICD-10-CM | POA: Diagnosis not present

## 2019-12-08 DIAGNOSIS — Z8501 Personal history of malignant neoplasm of esophagus: Secondary | ICD-10-CM | POA: Diagnosis not present

## 2019-12-08 DIAGNOSIS — J90 Pleural effusion, not elsewhere classified: Secondary | ICD-10-CM | POA: Diagnosis not present

## 2019-12-08 DIAGNOSIS — C155 Malignant neoplasm of lower third of esophagus: Secondary | ICD-10-CM | POA: Diagnosis not present

## 2019-12-08 DIAGNOSIS — I999 Unspecified disorder of circulatory system: Secondary | ICD-10-CM | POA: Diagnosis not present

## 2019-12-08 DIAGNOSIS — R7301 Impaired fasting glucose: Secondary | ICD-10-CM | POA: Diagnosis not present

## 2019-12-09 DIAGNOSIS — Z955 Presence of coronary angioplasty implant and graft: Secondary | ICD-10-CM | POA: Diagnosis not present

## 2019-12-09 DIAGNOSIS — I252 Old myocardial infarction: Secondary | ICD-10-CM | POA: Diagnosis not present

## 2019-12-12 DIAGNOSIS — I252 Old myocardial infarction: Secondary | ICD-10-CM | POA: Diagnosis not present

## 2019-12-12 DIAGNOSIS — Z955 Presence of coronary angioplasty implant and graft: Secondary | ICD-10-CM | POA: Diagnosis not present

## 2019-12-13 DIAGNOSIS — E785 Hyperlipidemia, unspecified: Secondary | ICD-10-CM | POA: Diagnosis not present

## 2019-12-13 DIAGNOSIS — E1169 Type 2 diabetes mellitus with other specified complication: Secondary | ICD-10-CM | POA: Diagnosis not present

## 2019-12-13 DIAGNOSIS — I2510 Atherosclerotic heart disease of native coronary artery without angina pectoris: Secondary | ICD-10-CM | POA: Diagnosis not present

## 2019-12-13 DIAGNOSIS — I10 Essential (primary) hypertension: Secondary | ICD-10-CM | POA: Diagnosis not present

## 2019-12-14 DIAGNOSIS — Z955 Presence of coronary angioplasty implant and graft: Secondary | ICD-10-CM | POA: Diagnosis not present

## 2019-12-14 DIAGNOSIS — I252 Old myocardial infarction: Secondary | ICD-10-CM | POA: Diagnosis not present

## 2019-12-14 DIAGNOSIS — L7632 Postprocedural hematoma of skin and subcutaneous tissue following other procedure: Secondary | ICD-10-CM | POA: Diagnosis not present

## 2019-12-16 DIAGNOSIS — Z955 Presence of coronary angioplasty implant and graft: Secondary | ICD-10-CM | POA: Diagnosis not present

## 2019-12-16 DIAGNOSIS — I252 Old myocardial infarction: Secondary | ICD-10-CM | POA: Diagnosis not present

## 2019-12-19 DIAGNOSIS — Z955 Presence of coronary angioplasty implant and graft: Secondary | ICD-10-CM | POA: Diagnosis not present

## 2019-12-19 DIAGNOSIS — I252 Old myocardial infarction: Secondary | ICD-10-CM | POA: Diagnosis not present

## 2019-12-19 DIAGNOSIS — L7632 Postprocedural hematoma of skin and subcutaneous tissue following other procedure: Secondary | ICD-10-CM | POA: Diagnosis not present

## 2019-12-21 DIAGNOSIS — Z955 Presence of coronary angioplasty implant and graft: Secondary | ICD-10-CM | POA: Diagnosis not present

## 2019-12-21 DIAGNOSIS — I252 Old myocardial infarction: Secondary | ICD-10-CM | POA: Diagnosis not present

## 2019-12-23 DIAGNOSIS — I252 Old myocardial infarction: Secondary | ICD-10-CM | POA: Diagnosis not present

## 2019-12-23 DIAGNOSIS — Z955 Presence of coronary angioplasty implant and graft: Secondary | ICD-10-CM | POA: Diagnosis not present

## 2019-12-26 DIAGNOSIS — Z955 Presence of coronary angioplasty implant and graft: Secondary | ICD-10-CM | POA: Diagnosis not present

## 2019-12-26 DIAGNOSIS — I252 Old myocardial infarction: Secondary | ICD-10-CM | POA: Diagnosis not present

## 2019-12-28 ENCOUNTER — Other Ambulatory Visit: Payer: Self-pay | Admitting: Gastroenterology

## 2019-12-28 DIAGNOSIS — C159 Malignant neoplasm of esophagus, unspecified: Secondary | ICD-10-CM | POA: Diagnosis not present

## 2019-12-28 DIAGNOSIS — R1310 Dysphagia, unspecified: Secondary | ICD-10-CM | POA: Diagnosis not present

## 2019-12-28 DIAGNOSIS — D649 Anemia, unspecified: Secondary | ICD-10-CM | POA: Diagnosis not present

## 2019-12-30 DIAGNOSIS — I469 Cardiac arrest, cause unspecified: Secondary | ICD-10-CM | POA: Diagnosis not present

## 2019-12-30 DIAGNOSIS — I252 Old myocardial infarction: Secondary | ICD-10-CM | POA: Diagnosis not present

## 2019-12-31 ENCOUNTER — Other Ambulatory Visit (HOSPITAL_COMMUNITY)
Admission: RE | Admit: 2019-12-31 | Discharge: 2019-12-31 | Disposition: A | Discharge status: Home / Self Care | Payer: Medicare HMO | Source: Ambulatory Visit | Attending: Gastroenterology | Admitting: Gastroenterology

## 2019-12-31 DIAGNOSIS — Z01812 Encounter for preprocedural laboratory examination: Secondary | ICD-10-CM | POA: Diagnosis not present

## 2019-12-31 DIAGNOSIS — Z20822 Contact with and (suspected) exposure to covid-19: Secondary | ICD-10-CM | POA: Diagnosis not present

## 2019-12-31 LAB — SARS CORONAVIRUS 2 (TAT 6-24 HRS): SARS Coronavirus 2: NEGATIVE

## 2020-01-02 DIAGNOSIS — I252 Old myocardial infarction: Secondary | ICD-10-CM | POA: Diagnosis not present

## 2020-01-02 DIAGNOSIS — I469 Cardiac arrest, cause unspecified: Secondary | ICD-10-CM | POA: Diagnosis not present

## 2020-01-03 DIAGNOSIS — E782 Mixed hyperlipidemia: Secondary | ICD-10-CM | POA: Diagnosis not present

## 2020-01-03 DIAGNOSIS — Z7982 Long term (current) use of aspirin: Secondary | ICD-10-CM | POA: Diagnosis not present

## 2020-01-03 DIAGNOSIS — I10 Essential (primary) hypertension: Secondary | ICD-10-CM | POA: Diagnosis not present

## 2020-01-03 DIAGNOSIS — I2510 Atherosclerotic heart disease of native coronary artery without angina pectoris: Secondary | ICD-10-CM | POA: Diagnosis not present

## 2020-01-03 DIAGNOSIS — I350 Nonrheumatic aortic (valve) stenosis: Secondary | ICD-10-CM | POA: Diagnosis not present

## 2020-01-04 ENCOUNTER — Other Ambulatory Visit: Payer: Self-pay

## 2020-01-04 ENCOUNTER — Ambulatory Visit (HOSPITAL_COMMUNITY): Payer: Medicare HMO | Admitting: Certified Registered"

## 2020-01-04 ENCOUNTER — Ambulatory Visit (HOSPITAL_COMMUNITY)
Admission: RE | Admit: 2020-01-04 | Discharge: 2020-01-04 | Disposition: A | Discharge status: Home / Self Care | Payer: Medicare HMO | Attending: Gastroenterology | Admitting: Gastroenterology

## 2020-01-04 ENCOUNTER — Encounter (HOSPITAL_COMMUNITY)
Admission: RE | Disposition: A | Discharge status: Home / Self Care | Payer: Self-pay | Source: Home / Self Care | Attending: Gastroenterology

## 2020-01-04 ENCOUNTER — Other Ambulatory Visit: Payer: Self-pay | Admitting: Gastroenterology

## 2020-01-04 ENCOUNTER — Encounter (HOSPITAL_COMMUNITY): Payer: Self-pay | Admitting: Gastroenterology

## 2020-01-04 DIAGNOSIS — Z7984 Long term (current) use of oral hypoglycemic drugs: Secondary | ICD-10-CM | POA: Insufficient documentation

## 2020-01-04 DIAGNOSIS — E7800 Pure hypercholesterolemia, unspecified: Secondary | ICD-10-CM | POA: Insufficient documentation

## 2020-01-04 DIAGNOSIS — I110 Hypertensive heart disease with heart failure: Secondary | ICD-10-CM | POA: Diagnosis not present

## 2020-01-04 DIAGNOSIS — I10 Essential (primary) hypertension: Secondary | ICD-10-CM | POA: Diagnosis not present

## 2020-01-04 DIAGNOSIS — Z79899 Other long term (current) drug therapy: Secondary | ICD-10-CM | POA: Insufficient documentation

## 2020-01-04 DIAGNOSIS — G4730 Sleep apnea, unspecified: Secondary | ICD-10-CM | POA: Diagnosis not present

## 2020-01-04 DIAGNOSIS — C160 Malignant neoplasm of cardia: Secondary | ICD-10-CM | POA: Diagnosis not present

## 2020-01-04 DIAGNOSIS — K219 Gastro-esophageal reflux disease without esophagitis: Secondary | ICD-10-CM | POA: Insufficient documentation

## 2020-01-04 DIAGNOSIS — I2510 Atherosclerotic heart disease of native coronary artery without angina pectoris: Secondary | ICD-10-CM | POA: Diagnosis not present

## 2020-01-04 DIAGNOSIS — Z955 Presence of coronary angioplasty implant and graft: Secondary | ICD-10-CM | POA: Diagnosis not present

## 2020-01-04 DIAGNOSIS — D649 Anemia, unspecified: Secondary | ICD-10-CM | POA: Diagnosis not present

## 2020-01-04 DIAGNOSIS — E119 Type 2 diabetes mellitus without complications: Secondary | ICD-10-CM | POA: Diagnosis not present

## 2020-01-04 DIAGNOSIS — Z87891 Personal history of nicotine dependence: Secondary | ICD-10-CM | POA: Insufficient documentation

## 2020-01-04 DIAGNOSIS — I5021 Acute systolic (congestive) heart failure: Secondary | ICD-10-CM | POA: Diagnosis not present

## 2020-01-04 DIAGNOSIS — K2289 Other specified disease of esophagus: Secondary | ICD-10-CM | POA: Insufficient documentation

## 2020-01-04 DIAGNOSIS — I252 Old myocardial infarction: Secondary | ICD-10-CM | POA: Diagnosis not present

## 2020-01-04 DIAGNOSIS — C155 Malignant neoplasm of lower third of esophagus: Secondary | ICD-10-CM | POA: Diagnosis present

## 2020-01-04 HISTORY — DX: Acute myocardial infarction, unspecified: I21.9

## 2020-01-04 HISTORY — PX: ESOPHAGOGASTRODUODENOSCOPY (EGD) WITH PROPOFOL: SHX5813

## 2020-01-04 HISTORY — PX: EUS: SHX5427

## 2020-01-04 LAB — GLUCOSE, CAPILLARY: Glucose-Capillary: 89 mg/dL (ref 70–99)

## 2020-01-04 SURGERY — UPPER ENDOSCOPIC ULTRASOUND (EUS) RADIAL
Anesthesia: Monitor Anesthesia Care

## 2020-01-04 MED ORDER — PROPOFOL 500 MG/50ML IV EMUL
INTRAVENOUS | Status: DC | PRN
  Administered 2020-01-04: 125 ug/kg/min via INTRAVENOUS

## 2020-01-04 MED ORDER — PROPOFOL 1000 MG/100ML IV EMUL
INTRAVENOUS | Status: AC
  Filled 2020-01-04: qty 100

## 2020-01-04 MED ORDER — PROPOFOL 10 MG/ML IV BOLUS
INTRAVENOUS | Status: DC | PRN
  Administered 2020-01-04: 20 mg via INTRAVENOUS

## 2020-01-04 MED ORDER — SODIUM CHLORIDE 0.9 % IV SOLN: INTRAVENOUS | Status: DC

## 2020-01-04 MED ORDER — LACTATED RINGERS IV SOLN: INTRAVENOUS | Status: DC | PRN

## 2020-01-04 MED ORDER — PROPOFOL 10 MG/ML IV BOLUS
INTRAVENOUS | Status: AC
  Filled 2020-01-04: qty 20

## 2020-01-04 NOTE — Anesthesia Preprocedure Evaluation (Addendum)
Anesthesia Evaluation  Patient identified by MRN, date of birth, ID band Patient awake    Reviewed: Allergy & Precautions, NPO status , Patient's Chart, lab work & pertinent test results  Airway Mallampati: I  TM Distance: >3 FB Neck ROM: Full    Dental  (+) Missing, Poor Dentition,    Pulmonary sleep apnea , former smoker,    breath sounds clear to auscultation       Cardiovascular hypertension, Pt. on medications and Pt. on home beta blockers + CAD  + Valvular Problems/Murmurs  Rhythm:Regular Rate:Normal     Neuro/Psych  Neuromuscular disease negative psych ROS   GI/Hepatic Neg liver ROS, GERD  Medicated,  Endo/Other  diabetes, Type 2, Oral Hypoglycemic Agents  Renal/GU      Musculoskeletal  (+) Arthritis ,   Abdominal Normal abdominal exam  (+)   Peds  Hematology negative hematology ROS (+)   Anesthesia Other Findings   Reproductive/Obstetrics                           Anesthesia Physical Anesthesia Plan  ASA: III  Anesthesia Plan: MAC   Post-op Pain Management:    Induction: Intravenous  PONV Risk Score and Plan: 0 and Propofol infusion  Airway Management Planned: Natural Airway and Simple Face Mask  Additional Equipment: None  Intra-op Plan:   Post-operative Plan:   Informed Consent: I have reviewed the patients History and Physical, chart, labs and discussed the procedure including the risks, benefits and alternatives for the proposed anesthesia with the patient or authorized representative who has indicated his/her understanding and acceptance.       Plan Discussed with: CRNA  Anesthesia Plan Comments:         Anesthesia Quick Evaluation

## 2020-01-04 NOTE — Anesthesia Procedure Notes (Signed)
Procedure Name: MAC Date/Time: 01/04/2020 8:58 AM Performed by: Eben Burow, CRNA Pre-anesthesia Checklist: Patient identified, Emergency Drugs available, Suction available, Patient being monitored and Timeout performed Oxygen Delivery Method: Simple face mask Placement Confirmation: positive ETCO2

## 2020-01-04 NOTE — Anesthesia Postprocedure Evaluation (Signed)
Anesthesia Post Note  Patient: Jeremiah Ray  Procedure(s) Performed: UPPER ENDOSCOPIC ULTRASOUND (EUS) RADIAL VS. Linear (N/A )     Patient location during evaluation: PACU Anesthesia Type: MAC Level of consciousness: awake and alert Pain management: pain level controlled Vital Signs Assessment: post-procedure vital signs reviewed and stable Respiratory status: spontaneous breathing, nonlabored ventilation, respiratory function stable and patient connected to nasal cannula oxygen Cardiovascular status: stable and blood pressure returned to baseline Postop Assessment: no apparent nausea or vomiting Anesthetic complications: no   No complications documented.  Last Vitals:  Vitals:   01/04/20 0930 01/04/20 0940  BP: (!) 163/56 (!) 185/65  Pulse: (!) 59 (!) 58  Resp: 13 18  Temp:    SpO2: 100% 98%    Last Pain:  Vitals:   01/04/20 0940  TempSrc:   PainSc: 0-No pain                 Effie Berkshire

## 2020-01-04 NOTE — H&P (Signed)
Patient interval history reviewed.  Patient examined again.  There has been no change from documented H/P scanned into chart from our office) except as documented below.  Assessment:  1.  Esophageal adenocarcinoma.  Plan:  1.  Endoscopic ultrasound for locoregional staging. 2.  Patient remains on brilinta, given MI + stent in June 2021. 3.  Risks (bleeding, infection, bowel perforation that could require surgery, sedation-related changes in cardiopulmonary systems), benefits (identification and possible treatment of source of symptoms, exclusion of certain causes of symptoms), and alternatives (watchful waiting, radiographic imaging studies, empiric medical treatment) of upper endoscopy with ultrasound (EGD + EUS) were explained to patient/family in detail and patient wishes to proceed.

## 2020-01-04 NOTE — Transfer of Care (Signed)
Immediate Anesthesia Transfer of Care Note  Patient: Jeremiah Ray  Procedure(s) Performed: UPPER ENDOSCOPIC ULTRASOUND (EUS) RADIAL VS. Linear (N/A )  Patient Location: PACU and Endoscopy Unit  Anesthesia Type:MAC  Level of Consciousness: awake, drowsy and patient cooperative  Airway & Oxygen Therapy: Patient Spontanous Breathing and Patient connected to face mask oxygen  Post-op Assessment: Report given to RN and Post -op Vital signs reviewed and stable  Post vital signs: Reviewed and stable  Last Vitals:  Vitals Value Taken Time  BP    Temp    Pulse 57 01/04/20 0920  Resp 20 01/04/20 0920  SpO2 100 % 01/04/20 0920  Vitals shown include unvalidated device data.  Last Pain:  Vitals:   01/04/20 0736  TempSrc: Oral  PainSc: 0-No pain         Complications: No complications documented.

## 2020-01-04 NOTE — Op Note (Signed)
[facility] Patient Name: Jeremiah Ray Procedure Date: 01/04/2020 MRN: [id number] Attending MD: Arta Silence , MD Date of Birth: [birth year] CSN: [id number] Age: 74 Admit Type: Outpatient Procedure:                Upper EUS Indications:              Esophageal mucosal mass/polyp found on endoscopy Providers:                Arta Silence, MD, Cleda Daub, RN, Erenest Rasher, RN, Laverda Sorenson, Technician, Jefm Miles CRNA Referring MD:             [name], MD Medicines:                Monitored Anesthesia Care Complications:            No immediate complications. Estimated Blood Loss:     Estimated blood loss: none. Procedure:                Pre-Anesthesia Assessment:                           - Prior to the procedure, a History and Physical                            was performed, and patient medications and                            allergies were reviewed. The patient's tolerance of                            previous anesthesia was also reviewed. The risks                            and benefits of the procedure and the sedation                            options and risks were discussed with the patient.                            All questions were answered, and informed consent                            was obtained. Prior Anticoagulants: The patient has                            taken Brilinta, last dose was day of procedure. ASA                            Grade Assessment: III - A patient with severe  systemic disease. After reviewing the risks and                            benefits, the patient was deemed in satisfactory                            condition to undergo the procedure.                           After obtaining informed consent, the endoscope was                            passed under direct vision. Throughout the                             procedure, the patient's blood pressure, pulse, and                            oxygen saturations were monitored continuously. The                            [id number] ([id number]) Olympus was introduced through                            the mouth, and advanced to the lower third of                            esophagus. The [id number] ([id number]) Olympus                            gastroscope was introduced through the mouth, and                            advanced to the lower third of esophagus. The upper                            EUS was accomplished without difficulty. The                            patient tolerated the procedure well. Scope In: Scope Out: Findings:      ENDOSCOPIC FINDING: :      A large, submucosal and ulcerating mass was found at the       gastroesophageal junction, 37 cm from the incisors. The mass was       partially obstructing and circumferential. I could not pass the       diagnostic endoscope through the tumor stricture.      ENDOSONOGRAPHIC FINDING: :      An oval intramural (subepithelial) lesion was found in the       gastroesophageal junction. Views were limited, as the EUS scope was       unable to traverse the tumor stricture. The lesion was hypoechoic.       Sonographically, the origin appeared to be within the luminal       interface/superficial mucosa (Layer 1). The lesion also appeared to  involve the following wall layer(s): muscularis propria (Layer 4).      Mindful of the limitations of exam, no obvious periesophageal or       peritumoral lymphadenopathy was seen. Impression:               - Partially obstructing, malignant esophageal tumor                            was found at the gastroesophageal junction.                           - An intramural (subepithelial) lesion was found in                            the gastroesophageal junction. Neither EGD nor EUS                            scope able to traverse the stricture. Thus, staging                             via EUS is limited. However, upon visualized                            portions, it appeared to originate from within the                            luminal interface/superficial mucosa (Layer 1) and                            extend in parts through the muscularis propria.. A                            tissue diagnosis was obtained prior to this exam.                            This is of adenocarcinoma.                           - Mindful of limitations of exam (as described                            above), EUS locoregional staging is at least T3 N0                            Mx. Moderate Sedation:      None Recommendation:           - Discharge patient to home (via wheelchair).                           - Soft diet until further notice.                           - Return to referring physician as previously  scheduled. Procedure Code(s):        --- Professional ---                           [phone], Esophagoscopy, flexible, transoral; with                            endoscopic ultrasound examination Diagnosis Code(s):        --- Professional ---                           C16.0, Malignant neoplasm of cardia                           K22.8, Other specified diseases of esophagus CPT copyright 2019 [employer]. All rights reserved. The codes documented in this report are preliminary and upon coder review may  be revised to meet current compliance requirements. Arta Silence, MD 01/04/2020 9:33:13 AM This report has been signed electronically. Number of Addenda: 0

## 2020-01-04 NOTE — Discharge Instructions (Signed)
YOU HAD AN ENDOSCOPIC PROCEDURE TODAY: Refer to the procedure report and other information in the discharge instructions given to you for any specific questions about what was found during the examination. If this information does not answer your questions, please call [facility] office at [phone] to clarify.   YOU SHOULD EXPECT: Some feelings of bloating in the abdomen. Passage of more gas than usual. Walking can help get rid of the air that was put into your GI tract during the procedure and reduce the bloating. If you had a lower endoscopy (such as a colonoscopy or flexible sigmoidoscopy) you may notice spotting of blood in your stool or on the toilet paper. Some abdominal soreness may be present for a day or two, also.  DIET: Soft diet, indefinitely, do not advance.  Drink plenty of fluids but you should avoid alcoholic beverages for 24 hours. If you had a esophageal dilation, please see attached instructions for diet.    ACTIVITY: Your care partner should take you home directly after the procedure. You should plan to take it easy, moving slowly for the rest of the day. You can resume normal activity the day after the procedure however YOU SHOULD NOT DRIVE, use power tools, machinery or perform tasks that involve climbing or major physical exertion for 24 hours (because of the sedation medicines used during the test).   SYMPTOMS TO REPORT IMMEDIATELY: A gastroenterologist can be reached at any hour. Please call [phone]  for any of the following symptoms:    . Following upper endoscopy (EGD, EUS, ERCP, esophageal dilation) Vomiting of blood or coffee ground material  New, significant abdominal pain  New, significant chest pain or pain under the shoulder blades  Painful or persistently difficult swallowing  New shortness of breath  Black, tarry-looking or red, bloody stools  FOLLOW UP:  If any biopsies were taken you will be contacted by phone or by letter within the next 1-3 weeks.  Call [phone]  if you have not heard about the biopsies in 3 weeks.  Please also call with any specific questions about appointments or follow up tests.

## 2020-01-05 DIAGNOSIS — C155 Malignant neoplasm of lower third of esophagus: Secondary | ICD-10-CM | POA: Diagnosis not present

## 2020-01-05 DIAGNOSIS — Z510 Encounter for antineoplastic radiation therapy: Secondary | ICD-10-CM | POA: Diagnosis not present

## 2020-01-06 ENCOUNTER — Encounter: Payer: Self-pay | Admitting: Pharmacist

## 2020-01-06 DIAGNOSIS — I469 Cardiac arrest, cause unspecified: Secondary | ICD-10-CM | POA: Diagnosis not present

## 2020-01-06 DIAGNOSIS — C155 Malignant neoplasm of lower third of esophagus: Secondary | ICD-10-CM

## 2020-01-06 DIAGNOSIS — I252 Old myocardial infarction: Secondary | ICD-10-CM | POA: Diagnosis not present

## 2020-01-06 LAB — CREATININE, SERUM: Creatinine: 0.9 (ref <=1.3)

## 2020-01-09 ENCOUNTER — Encounter (HOSPITAL_COMMUNITY): Payer: Self-pay | Admitting: Gastroenterology

## 2020-01-09 DIAGNOSIS — J90 Pleural effusion, not elsewhere classified: Secondary | ICD-10-CM | POA: Diagnosis not present

## 2020-01-09 DIAGNOSIS — Z510 Encounter for antineoplastic radiation therapy: Secondary | ICD-10-CM | POA: Diagnosis not present

## 2020-01-09 DIAGNOSIS — R633 Feeding difficulties, unspecified: Secondary | ICD-10-CM | POA: Diagnosis not present

## 2020-01-09 DIAGNOSIS — Z1159 Encounter for screening for other viral diseases: Secondary | ICD-10-CM | POA: Diagnosis not present

## 2020-01-09 DIAGNOSIS — C155 Malignant neoplasm of lower third of esophagus: Secondary | ICD-10-CM | POA: Diagnosis not present

## 2020-01-11 ENCOUNTER — Other Ambulatory Visit: Payer: Self-pay | Admitting: Oncology

## 2020-01-11 DIAGNOSIS — I10 Essential (primary) hypertension: Secondary | ICD-10-CM | POA: Diagnosis not present

## 2020-01-11 DIAGNOSIS — N400 Enlarged prostate without lower urinary tract symptoms: Secondary | ICD-10-CM | POA: Diagnosis not present

## 2020-01-11 DIAGNOSIS — C155 Malignant neoplasm of lower third of esophagus: Secondary | ICD-10-CM | POA: Diagnosis not present

## 2020-01-11 DIAGNOSIS — R633 Feeding difficulties, unspecified: Secondary | ICD-10-CM | POA: Diagnosis not present

## 2020-01-11 DIAGNOSIS — Z955 Presence of coronary angioplasty implant and graft: Secondary | ICD-10-CM | POA: Diagnosis not present

## 2020-01-11 DIAGNOSIS — E119 Type 2 diabetes mellitus without complications: Secondary | ICD-10-CM | POA: Diagnosis not present

## 2020-01-11 DIAGNOSIS — Z7984 Long term (current) use of oral hypoglycemic drugs: Secondary | ICD-10-CM | POA: Diagnosis not present

## 2020-01-11 DIAGNOSIS — I252 Old myocardial infarction: Secondary | ICD-10-CM | POA: Diagnosis not present

## 2020-01-11 DIAGNOSIS — K219 Gastro-esophageal reflux disease without esophagitis: Secondary | ICD-10-CM | POA: Diagnosis not present

## 2020-01-11 DIAGNOSIS — Z79899 Other long term (current) drug therapy: Secondary | ICD-10-CM | POA: Diagnosis not present

## 2020-01-11 DIAGNOSIS — Z87891 Personal history of nicotine dependence: Secondary | ICD-10-CM | POA: Diagnosis not present

## 2020-01-12 DIAGNOSIS — C155 Malignant neoplasm of lower third of esophagus: Secondary | ICD-10-CM | POA: Diagnosis not present

## 2020-01-12 DIAGNOSIS — J90 Pleural effusion, not elsewhere classified: Secondary | ICD-10-CM | POA: Diagnosis not present

## 2020-01-12 DIAGNOSIS — Z510 Encounter for antineoplastic radiation therapy: Secondary | ICD-10-CM | POA: Diagnosis not present

## 2020-01-13 ENCOUNTER — Inpatient Hospital Stay: Payer: Medicare HMO

## 2020-01-13 DIAGNOSIS — J90 Pleural effusion, not elsewhere classified: Secondary | ICD-10-CM | POA: Diagnosis not present

## 2020-01-13 DIAGNOSIS — C155 Malignant neoplasm of lower third of esophagus: Secondary | ICD-10-CM | POA: Diagnosis not present

## 2020-01-13 DIAGNOSIS — Z510 Encounter for antineoplastic radiation therapy: Secondary | ICD-10-CM | POA: Diagnosis not present

## 2020-01-16 DIAGNOSIS — Z510 Encounter for antineoplastic radiation therapy: Secondary | ICD-10-CM | POA: Diagnosis not present

## 2020-01-16 DIAGNOSIS — J90 Pleural effusion, not elsewhere classified: Secondary | ICD-10-CM | POA: Diagnosis not present

## 2020-01-16 DIAGNOSIS — C155 Malignant neoplasm of lower third of esophagus: Secondary | ICD-10-CM | POA: Diagnosis not present

## 2020-01-17 DIAGNOSIS — Z510 Encounter for antineoplastic radiation therapy: Secondary | ICD-10-CM | POA: Diagnosis not present

## 2020-01-17 DIAGNOSIS — C155 Malignant neoplasm of lower third of esophagus: Secondary | ICD-10-CM | POA: Diagnosis not present

## 2020-01-18 DIAGNOSIS — C155 Malignant neoplasm of lower third of esophagus: Secondary | ICD-10-CM | POA: Diagnosis not present

## 2020-01-18 NOTE — Progress Notes (Signed)
Chemotherapy doses double checked:  Henreitta Leber, PharmD

## 2020-01-19 ENCOUNTER — Other Ambulatory Visit: Payer: Self-pay

## 2020-01-19 ENCOUNTER — Inpatient Hospital Stay: Payer: Medicare HMO | Attending: Oncology

## 2020-01-19 VITALS — BP 139/67 | HR 77 | Temp 98.1°F | Resp 18 | Ht 67.0 in | Wt 176.5 lb

## 2020-01-19 DIAGNOSIS — C155 Malignant neoplasm of lower third of esophagus: Secondary | ICD-10-CM

## 2020-01-19 DIAGNOSIS — Z5111 Encounter for antineoplastic chemotherapy: Secondary | ICD-10-CM | POA: Diagnosis not present

## 2020-01-19 DIAGNOSIS — Z510 Encounter for antineoplastic radiation therapy: Secondary | ICD-10-CM | POA: Diagnosis not present

## 2020-01-19 DIAGNOSIS — C160 Malignant neoplasm of cardia: Secondary | ICD-10-CM | POA: Diagnosis not present

## 2020-01-19 MED ORDER — HEPARIN SOD (PORK) LOCK FLUSH 100 UNIT/ML IV SOLN
500.0000 [IU] | Freq: Once | INTRAVENOUS | Status: AC | PRN
  Administered 2020-01-19: 500 [IU]
  Filled 2020-01-19: qty 5

## 2020-01-19 MED ORDER — DIPHENHYDRAMINE HCL 50 MG/ML IJ SOLN
50.0000 mg | Freq: Once | INTRAMUSCULAR | Status: AC
  Administered 2020-01-19: 50 mg via INTRAVENOUS

## 2020-01-19 MED ORDER — FAMOTIDINE IN NACL 20-0.9 MG/50ML-% IV SOLN
INTRAVENOUS | Status: AC
  Filled 2020-01-19: qty 50

## 2020-01-19 MED ORDER — PALONOSETRON HCL INJECTION 0.25 MG/5ML
0.2500 mg | Freq: Once | INTRAVENOUS | Status: AC
  Administered 2020-01-19: 0.25 mg via INTRAVENOUS

## 2020-01-19 MED ORDER — PALONOSETRON HCL INJECTION 0.25 MG/5ML
INTRAVENOUS | Status: AC
  Filled 2020-01-19: qty 5

## 2020-01-19 MED ORDER — SODIUM CHLORIDE 0.9% FLUSH
10.0000 mL | INTRAVENOUS | Status: DC | PRN
  Administered 2020-01-19: 10 mL
  Filled 2020-01-19: qty 10

## 2020-01-19 MED ORDER — SODIUM CHLORIDE 0.9 % IV SOLN
10.0000 mg | Freq: Once | INTRAVENOUS | Status: AC
  Administered 2020-01-19: 10 mg via INTRAVENOUS
  Filled 2020-01-19: qty 10

## 2020-01-19 MED ORDER — SODIUM CHLORIDE 0.9 % IV SOLN
Freq: Once | INTRAVENOUS | Status: AC
  Filled 2020-01-19: qty 250

## 2020-01-19 MED ORDER — SODIUM CHLORIDE 0.9 % IV SOLN
192.4000 mg | Freq: Once | INTRAVENOUS | Status: AC
  Administered 2020-01-19: 190 mg via INTRAVENOUS
  Filled 2020-01-19: qty 19

## 2020-01-19 MED ORDER — DIPHENHYDRAMINE HCL 50 MG/ML IJ SOLN
INTRAMUSCULAR | Status: AC
  Filled 2020-01-19: qty 1

## 2020-01-19 MED ORDER — SODIUM CHLORIDE 0.9 % IV SOLN
50.0000 mg/m2 | Freq: Once | INTRAVENOUS | Status: AC
  Administered 2020-01-19: 96 mg via INTRAVENOUS
  Filled 2020-01-19: qty 16

## 2020-01-19 MED ORDER — FAMOTIDINE IN NACL 20-0.9 MG/50ML-% IV SOLN
20.0000 mg | Freq: Once | INTRAVENOUS | Status: AC
  Administered 2020-01-19: 20 mg via INTRAVENOUS

## 2020-01-19 NOTE — Patient Instructions (Signed)
Carboplatin injection What is this medicine? CARBOPLATIN (KAR boe pla tin) is a chemotherapy drug. It targets fast dividing cells, like cancer cells, and causes these cells to die. This medicine is used to treat ovarian cancer and many other cancers. This medicine may be used for other purposes; ask your health care provider or pharmacist if you have questions. COMMON BRAND NAME(S): Paraplatin What should I tell my health care provider before I take this medicine? They need to know if you have any of these conditions:  blood disorders  hearing problems  kidney disease  recent or ongoing radiation therapy  an unusual or allergic reaction to carboplatin, cisplatin, other chemotherapy, other medicines, foods, dyes, or preservatives  pregnant or trying to get pregnant  breast-feeding How should I use this medicine? This drug is usually given as an infusion into a vein. It is administered in a hospital or clinic by a specially trained health care professional. Talk to your pediatrician regarding the use of this medicine in children. Special care may be needed. Overdosage: If you think you have taken too much of this medicine contact a poison control center or emergency room at once. NOTE: This medicine is only for you. Do not share this medicine with others. What if I miss a dose? It is important not to miss a dose. Call your doctor or health care professional if you are unable to keep an appointment. What may interact with this medicine?  medicines for seizures  medicines to increase blood counts like filgrastim, pegfilgrastim, sargramostim  some antibiotics like amikacin, gentamicin, neomycin, streptomycin, tobramycin  vaccines Talk to your doctor or health care professional before taking any of these medicines:  acetaminophen  aspirin  ibuprofen  ketoprofen  naproxen This list may not describe all possible interactions. Give your health care provider a list of all the  medicines, herbs, non-prescription drugs, or dietary supplements you use. Also tell them if you smoke, drink alcohol, or use illegal drugs. Some items may interact with your medicine. What should I watch for while using this medicine? Your condition will be monitored carefully while you are receiving this medicine. You will need important blood work done while you are taking this medicine. This drug may make you feel generally unwell. This is not uncommon, as chemotherapy can affect healthy cells as well as cancer cells. Report any side effects. Continue your course of treatment even though you feel ill unless your doctor tells you to stop. In some cases, you may be given additional medicines to help with side effects. Follow all directions for their use. Call your doctor or health care professional for advice if you get a fever, chills or sore throat, or other symptoms of a cold or flu. Do not treat yourself. This drug decreases your body's ability to fight infections. Try to avoid being around people who are sick. This medicine may increase your risk to bruise or bleed. Call your doctor or health care professional if you notice any unusual bleeding. Be careful brushing and flossing your teeth or using a toothpick because you may get an infection or bleed more easily. If you have any dental work done, tell your dentist you are receiving this medicine. Avoid taking products that contain aspirin, acetaminophen, ibuprofen, naproxen, or ketoprofen unless instructed by your doctor. These medicines may hide a fever. Do not become pregnant while taking this medicine. Women should inform their doctor if they wish to become pregnant or think they might be pregnant. There is a   potential for serious side effects to an unborn child. Talk to your health care professional or pharmacist for more information. Do not breast-feed an infant while taking this medicine. What side effects may I notice from receiving this  medicine? Side effects that you should report to your doctor or health care professional as soon as possible:  allergic reactions like skin rash, itching or hives, swelling of the face, lips, or tongue  signs of infection - fever or chills, cough, sore throat, pain or difficulty passing urine  signs of decreased platelets or bleeding - bruising, pinpoint red spots on the skin, black, tarry stools, nosebleeds  signs of decreased red blood cells - unusually weak or tired, fainting spells, lightheadedness  breathing problems  changes in hearing  changes in vision  chest pain  high blood pressure  low blood counts - This drug may decrease the number of white blood cells, red blood cells and platelets. You may be at increased risk for infections and bleeding.  nausea and vomiting  pain, swelling, redness or irritation at the injection site  pain, tingling, numbness in the hands or feet  problems with balance, talking, walking  trouble passing urine or change in the amount of urine Side effects that usually do not require medical attention (report to your doctor or health care professional if they continue or are bothersome):  hair loss  loss of appetite  metallic taste in the mouth or changes in taste This list may not describe all possible side effects. Call your doctor for medical advice about side effects. You may report side effects to FDA at 1-800-FDA-1088. Where should I keep my medicine? This drug is given in a hospital or clinic and will not be stored at home. NOTE: This sheet is a summary. It may not cover all possible information. If you have questions about this medicine, talk to your doctor, pharmacist, or health care provider.  2020 Elsevier/Gold Standard (2007-06-22 14:38:05)  Paclitaxel injection What is this medicine? PACLITAXEL (PAK li TAX el) is a chemotherapy drug. It targets fast dividing cells, like cancer cells, and causes these cells to die. This  medicine is used to treat ovarian cancer, breast cancer, lung cancer, Kaposi's sarcoma, and other cancers. This medicine may be used for other purposes; ask your health care provider or pharmacist if you have questions. COMMON BRAND NAME(S): Onxol, Taxol What should I tell my health care provider before I take this medicine? They need to know if you have any of these conditions:  history of irregular heartbeat  liver disease  low blood counts, like low white cell, platelet, or red cell counts  lung or breathing disease, like asthma  tingling of the fingers or toes, or other nerve disorder  an unusual or allergic reaction to paclitaxel, alcohol, polyoxyethylated castor oil, other chemotherapy, other medicines, foods, dyes, or preservatives  pregnant or trying to get pregnant  breast-feeding How should I use this medicine? This drug is given as an infusion into a vein. It is administered in a hospital or clinic by a specially trained health care professional. Talk to your pediatrician regarding the use of this medicine in children. Special care may be needed. Overdosage: If you think you have taken too much of this medicine contact a poison control center or emergency room at once. NOTE: This medicine is only for you. Do not share this medicine with others. What if I miss a dose? It is important not to miss your dose. Call your doctor   or health care professional if you are unable to keep an appointment. What may interact with this medicine? Do not take this medicine with any of the following medications:  disulfiram  metronidazole This medicine may also interact with the following medications:  antiviral medicines for hepatitis, HIV or AIDS  certain antibiotics like erythromycin and clarithromycin  certain medicines for fungal infections like ketoconazole and itraconazole  certain medicines for seizures like carbamazepine, phenobarbital,  phenytoin  gemfibrozil  nefazodone  rifampin  St. John's wort This list may not describe all possible interactions. Give your health care provider a list of all the medicines, herbs, non-prescription drugs, or dietary supplements you use. Also tell them if you smoke, drink alcohol, or use illegal drugs. Some items may interact with your medicine. What should I watch for while using this medicine? Your condition will be monitored carefully while you are receiving this medicine. You will need important blood work done while you are taking this medicine. This medicine can cause serious allergic reactions. To reduce your risk you will need to take other medicine(s) before treatment with this medicine. If you experience allergic reactions like skin rash, itching or hives, swelling of the face, lips, or tongue, tell your doctor or health care professional right away. In some cases, you may be given additional medicines to help with side effects. Follow all directions for their use. This drug may make you feel generally unwell. This is not uncommon, as chemotherapy can affect healthy cells as well as cancer cells. Report any side effects. Continue your course of treatment even though you feel ill unless your doctor tells you to stop. Call your doctor or health care professional for advice if you get a fever, chills or sore throat, or other symptoms of a cold or flu. Do not treat yourself. This drug decreases your body's ability to fight infections. Try to avoid being around people who are sick. This medicine may increase your risk to bruise or bleed. Call your doctor or health care professional if you notice any unusual bleeding. Be careful brushing and flossing your teeth or using a toothpick because you may get an infection or bleed more easily. If you have any dental work done, tell your dentist you are receiving this medicine. Avoid taking products that contain aspirin, acetaminophen, ibuprofen,  naproxen, or ketoprofen unless instructed by your doctor. These medicines may hide a fever. Do not become pregnant while taking this medicine. Women should inform their doctor if they wish to become pregnant or think they might be pregnant. There is a potential for serious side effects to an unborn child. Talk to your health care professional or pharmacist for more information. Do not breast-feed an infant while taking this medicine. Men are advised not to father a child while receiving this medicine. This product may contain alcohol. Ask your pharmacist or healthcare provider if this medicine contains alcohol. Be sure to tell all healthcare providers you are taking this medicine. Certain medicines, like metronidazole and disulfiram, can cause an unpleasant reaction when taken with alcohol. The reaction includes flushing, headache, nausea, vomiting, sweating, and increased thirst. The reaction can last from 30 minutes to several hours. What side effects may I notice from receiving this medicine? Side effects that you should report to your doctor or health care professional as soon as possible:  allergic reactions like skin rash, itching or hives, swelling of the face, lips, or tongue  breathing problems  changes in vision  fast, irregular heartbeat  high   or low blood pressure  mouth sores  pain, tingling, numbness in the hands or feet  signs of decreased platelets or bleeding - bruising, pinpoint red spots on the skin, black, tarry stools, blood in the urine  signs of decreased red blood cells - unusually weak or tired, feeling faint or lightheaded, falls  signs of infection - fever or chills, cough, sore throat, pain or difficulty passing urine  signs and symptoms of liver injury like dark yellow or brown urine; general ill feeling or flu-like symptoms; light-colored stools; loss of appetite; nausea; right upper belly pain; unusually weak or tired; yellowing of the eyes or  skin  swelling of the ankles, feet, hands  unusually slow heartbeat Side effects that usually do not require medical attention (report to your doctor or health care professional if they continue or are bothersome):  diarrhea  hair loss  loss of appetite  muscle or joint pain  nausea, vomiting  pain, redness, or irritation at site where injected  tiredness This list may not describe all possible side effects. Call your doctor for medical advice about side effects. You may report side effects to FDA at 1-800-FDA-1088. Where should I keep my medicine? This drug is given in a hospital or clinic and will not be stored at home. NOTE: This sheet is a summary. It may not cover all possible information. If you have questions about this medicine, talk to your doctor, pharmacist, or health care provider.  2020 Elsevier/Gold Standard (2016-11-18 13:14:55)  

## 2020-01-19 NOTE — Progress Notes (Signed)
PT STABLE AT TIME OF DISCHARGE 

## 2020-01-20 ENCOUNTER — Inpatient Hospital Stay: Payer: Medicare HMO

## 2020-01-20 DIAGNOSIS — C155 Malignant neoplasm of lower third of esophagus: Secondary | ICD-10-CM | POA: Diagnosis not present

## 2020-01-20 DIAGNOSIS — R338 Other retention of urine: Secondary | ICD-10-CM | POA: Diagnosis not present

## 2020-01-20 DIAGNOSIS — R339 Retention of urine, unspecified: Secondary | ICD-10-CM | POA: Diagnosis not present

## 2020-01-23 DIAGNOSIS — Z510 Encounter for antineoplastic radiation therapy: Secondary | ICD-10-CM | POA: Diagnosis not present

## 2020-01-23 DIAGNOSIS — N390 Urinary tract infection, site not specified: Secondary | ICD-10-CM | POA: Diagnosis not present

## 2020-01-23 DIAGNOSIS — R339 Retention of urine, unspecified: Secondary | ICD-10-CM | POA: Diagnosis not present

## 2020-01-23 DIAGNOSIS — N401 Enlarged prostate with lower urinary tract symptoms: Secondary | ICD-10-CM | POA: Diagnosis not present

## 2020-01-23 DIAGNOSIS — C155 Malignant neoplasm of lower third of esophagus: Secondary | ICD-10-CM | POA: Diagnosis not present

## 2020-01-24 ENCOUNTER — Other Ambulatory Visit: Payer: Self-pay | Admitting: Hematology and Oncology

## 2020-01-24 DIAGNOSIS — J90 Pleural effusion, not elsewhere classified: Secondary | ICD-10-CM | POA: Diagnosis not present

## 2020-01-24 DIAGNOSIS — D509 Iron deficiency anemia, unspecified: Secondary | ICD-10-CM | POA: Diagnosis not present

## 2020-01-24 DIAGNOSIS — R1310 Dysphagia, unspecified: Secondary | ICD-10-CM | POA: Diagnosis not present

## 2020-01-24 DIAGNOSIS — C155 Malignant neoplasm of lower third of esophagus: Secondary | ICD-10-CM | POA: Diagnosis not present

## 2020-01-24 LAB — CBC AND DIFFERENTIAL
HCT: 34 — AB (ref 41–53)
Hemoglobin: 11.5 — AB (ref 13.5–17.5)
Neutrophils Absolute: 5.86
Platelets: 203 (ref 150–399)
WBC: 6.1

## 2020-01-24 LAB — HEPATIC FUNCTION PANEL
ALT: 24 (ref 10–40)
AST: 35 (ref 14–40)
Alkaline Phosphatase: 73 (ref 25–125)
Bilirubin, Total: 0.6

## 2020-01-24 LAB — BASIC METABOLIC PANEL
BUN: 25 — AB (ref 4–21)
CO2: 26 — AB (ref 13–22)
Chloride: 98 — AB (ref 99–108)
Creatinine: 0.8 (ref 0.6–1.3)
Glucose: 110
Potassium: 4.1 (ref 3.4–5.3)
Sodium: 134 — AB (ref 137–147)

## 2020-01-24 LAB — COMPREHENSIVE METABOLIC PANEL
Albumin: 3.5 (ref 3.5–5.0)
Calcium: 9.1 (ref 8.7–10.7)

## 2020-01-24 LAB — CBC: RBC: 3.74 — AB (ref 3.87–5.11)

## 2020-01-25 DIAGNOSIS — N390 Urinary tract infection, site not specified: Secondary | ICD-10-CM | POA: Diagnosis not present

## 2020-01-25 DIAGNOSIS — N401 Enlarged prostate with lower urinary tract symptoms: Secondary | ICD-10-CM | POA: Diagnosis not present

## 2020-01-25 DIAGNOSIS — C155 Malignant neoplasm of lower third of esophagus: Secondary | ICD-10-CM | POA: Diagnosis not present

## 2020-01-25 DIAGNOSIS — Z510 Encounter for antineoplastic radiation therapy: Secondary | ICD-10-CM | POA: Diagnosis not present

## 2020-01-26 ENCOUNTER — Ambulatory Visit: Payer: Medicare HMO

## 2020-01-26 ENCOUNTER — Inpatient Hospital Stay: Payer: Medicare HMO

## 2020-01-26 ENCOUNTER — Other Ambulatory Visit: Payer: Self-pay

## 2020-01-26 VITALS — BP 111/62 | HR 64 | Temp 98.1°F | Resp 18 | Ht 67.0 in | Wt 172.0 lb

## 2020-01-26 DIAGNOSIS — C155 Malignant neoplasm of lower third of esophagus: Secondary | ICD-10-CM

## 2020-01-26 DIAGNOSIS — Z5111 Encounter for antineoplastic chemotherapy: Secondary | ICD-10-CM | POA: Diagnosis not present

## 2020-01-26 DIAGNOSIS — C160 Malignant neoplasm of cardia: Secondary | ICD-10-CM | POA: Diagnosis not present

## 2020-01-26 DIAGNOSIS — Z510 Encounter for antineoplastic radiation therapy: Secondary | ICD-10-CM | POA: Diagnosis not present

## 2020-01-26 MED ORDER — FAMOTIDINE IN NACL 20-0.9 MG/50ML-% IV SOLN
INTRAVENOUS | Status: AC
  Filled 2020-01-26: qty 50

## 2020-01-26 MED ORDER — SODIUM CHLORIDE 0.9 % IV SOLN
50.0000 mg/m2 | Freq: Once | INTRAVENOUS | Status: AC
  Administered 2020-01-26: 96 mg via INTRAVENOUS
  Filled 2020-01-26: qty 16

## 2020-01-26 MED ORDER — SODIUM CHLORIDE 0.9% FLUSH
10.0000 mL | INTRAVENOUS | Status: DC | PRN
  Administered 2020-01-26: 10 mL
  Filled 2020-01-26: qty 10

## 2020-01-26 MED ORDER — PALONOSETRON HCL INJECTION 0.25 MG/5ML
INTRAVENOUS | Status: AC
  Filled 2020-01-26: qty 5

## 2020-01-26 MED ORDER — SODIUM CHLORIDE 0.9 % IV SOLN
10.0000 mg | Freq: Once | INTRAVENOUS | Status: AC
  Administered 2020-01-26: 10 mg via INTRAVENOUS
  Filled 2020-01-26: qty 10

## 2020-01-26 MED ORDER — HEPARIN SOD (PORK) LOCK FLUSH 100 UNIT/ML IV SOLN
500.0000 [IU] | Freq: Once | INTRAVENOUS | Status: AC | PRN
  Administered 2020-01-26: 500 [IU]
  Filled 2020-01-26: qty 5

## 2020-01-26 MED ORDER — SODIUM CHLORIDE 0.9 % IV SOLN
Freq: Once | INTRAVENOUS | Status: AC
  Filled 2020-01-26: qty 250

## 2020-01-26 MED ORDER — SODIUM CHLORIDE 0.9 % IV SOLN
192.4000 mg | Freq: Once | INTRAVENOUS | Status: AC
  Administered 2020-01-26: 190 mg via INTRAVENOUS
  Filled 2020-01-26: qty 19

## 2020-01-26 MED ORDER — DIPHENHYDRAMINE HCL 50 MG/ML IJ SOLN
INTRAMUSCULAR | Status: AC
  Filled 2020-01-26: qty 1

## 2020-01-26 MED ORDER — PALONOSETRON HCL INJECTION 0.25 MG/5ML
0.2500 mg | Freq: Once | INTRAVENOUS | Status: AC
  Administered 2020-01-26: 0.25 mg via INTRAVENOUS

## 2020-01-26 MED ORDER — FAMOTIDINE IN NACL 20-0.9 MG/50ML-% IV SOLN
20.0000 mg | Freq: Once | INTRAVENOUS | Status: AC
  Administered 2020-01-26: 20 mg via INTRAVENOUS

## 2020-01-26 MED ORDER — DIPHENHYDRAMINE HCL 50 MG/ML IJ SOLN
50.0000 mg | Freq: Once | INTRAMUSCULAR | Status: AC
  Administered 2020-01-26: 50 mg via INTRAVENOUS

## 2020-01-26 NOTE — Patient Instructions (Signed)
[facility] Discharge Instructions for Patients Receiving Chemotherapy  Today you received the following chemotherapy agents TAXOL CARBOPLATIN  To help prevent nausea and vomiting after your treatment, we encourage you to take your nausea medication.   If you develop nausea and vomiting that is not controlled by your nausea medication, call the clinic.   BELOW ARE SYMPTOMS THAT SHOULD BE REPORTED IMMEDIATELY:  *FEVER GREATER THAN 100.5 F  *CHILLS WITH OR WITHOUT FEVER  NAUSEA AND VOMITING THAT IS NOT CONTROLLED WITH YOUR NAUSEA MEDICATION  *UNUSUAL SHORTNESS OF BREATH  *UNUSUAL BRUISING OR BLEEDING  TENDERNESS IN MOUTH AND THROAT WITH OR WITHOUT PRESENCE OF ULCERS  *URINARY PROBLEMS  *BOWEL PROBLEMS  UNUSUAL RASH Items with * indicate a potential emergency and should be followed up as soon as possible.  Feel free to call the clinic should you have any questions or concerns at The clinic phone number is [phone].  Please show the [facility] at check-in to the Emergency Department and triage nurse.

## 2020-01-26 NOTE — Progress Notes (Signed)
PT STABLE AT TIME OF DISCHARGE 

## 2020-01-27 ENCOUNTER — Ambulatory Visit: Payer: Medicare HMO

## 2020-01-27 DIAGNOSIS — C155 Malignant neoplasm of lower third of esophagus: Secondary | ICD-10-CM | POA: Diagnosis not present

## 2020-01-27 DIAGNOSIS — Z510 Encounter for antineoplastic radiation therapy: Secondary | ICD-10-CM | POA: Diagnosis not present

## 2020-01-28 DIAGNOSIS — E669 Obesity, unspecified: Secondary | ICD-10-CM | POA: Diagnosis not present

## 2020-01-28 DIAGNOSIS — I959 Hypotension, unspecified: Secondary | ICD-10-CM | POA: Diagnosis not present

## 2020-01-28 DIAGNOSIS — R509 Fever, unspecified: Secondary | ICD-10-CM | POA: Diagnosis not present

## 2020-01-28 DIAGNOSIS — I252 Old myocardial infarction: Secondary | ICD-10-CM | POA: Diagnosis not present

## 2020-01-28 DIAGNOSIS — B9689 Other specified bacterial agents as the cause of diseases classified elsewhere: Secondary | ICD-10-CM | POA: Diagnosis not present

## 2020-01-28 DIAGNOSIS — I10 Essential (primary) hypertension: Secondary | ICD-10-CM | POA: Diagnosis not present

## 2020-01-28 DIAGNOSIS — A419 Sepsis, unspecified organism: Secondary | ICD-10-CM | POA: Diagnosis not present

## 2020-01-28 DIAGNOSIS — D509 Iron deficiency anemia, unspecified: Secondary | ICD-10-CM | POA: Diagnosis not present

## 2020-01-28 DIAGNOSIS — R0902 Hypoxemia: Secondary | ICD-10-CM | POA: Diagnosis not present

## 2020-01-28 DIAGNOSIS — M1990 Unspecified osteoarthritis, unspecified site: Secondary | ICD-10-CM | POA: Diagnosis not present

## 2020-01-28 DIAGNOSIS — C155 Malignant neoplasm of lower third of esophagus: Secondary | ICD-10-CM | POA: Diagnosis not present

## 2020-01-28 DIAGNOSIS — N3000 Acute cystitis without hematuria: Secondary | ICD-10-CM | POA: Diagnosis not present

## 2020-01-28 DIAGNOSIS — Z7982 Long term (current) use of aspirin: Secondary | ICD-10-CM | POA: Diagnosis not present

## 2020-01-28 DIAGNOSIS — E1169 Type 2 diabetes mellitus with other specified complication: Secondary | ICD-10-CM | POA: Diagnosis not present

## 2020-01-28 DIAGNOSIS — D630 Anemia in neoplastic disease: Secondary | ICD-10-CM | POA: Diagnosis not present

## 2020-01-28 DIAGNOSIS — R5381 Other malaise: Secondary | ICD-10-CM | POA: Diagnosis not present

## 2020-01-29 DIAGNOSIS — D509 Iron deficiency anemia, unspecified: Secondary | ICD-10-CM

## 2020-01-29 DIAGNOSIS — R509 Fever, unspecified: Secondary | ICD-10-CM

## 2020-01-29 DIAGNOSIS — C155 Malignant neoplasm of lower third of esophagus: Secondary | ICD-10-CM

## 2020-01-29 DIAGNOSIS — D630 Anemia in neoplastic disease: Secondary | ICD-10-CM | POA: Diagnosis not present

## 2020-01-29 DIAGNOSIS — A419 Sepsis, unspecified organism: Secondary | ICD-10-CM | POA: Diagnosis not present

## 2020-01-30 DIAGNOSIS — Z510 Encounter for antineoplastic radiation therapy: Secondary | ICD-10-CM | POA: Diagnosis not present

## 2020-01-30 DIAGNOSIS — C155 Malignant neoplasm of lower third of esophagus: Secondary | ICD-10-CM | POA: Diagnosis not present

## 2020-01-31 ENCOUNTER — Inpatient Hospital Stay: Payer: Medicare HMO | Attending: Oncology | Admitting: Oncology

## 2020-01-31 ENCOUNTER — Other Ambulatory Visit: Payer: Self-pay

## 2020-01-31 ENCOUNTER — Inpatient Hospital Stay: Payer: Medicare HMO

## 2020-01-31 ENCOUNTER — Encounter: Payer: Self-pay | Admitting: Oncology

## 2020-01-31 ENCOUNTER — Telehealth: Payer: Self-pay

## 2020-01-31 VITALS — BP 146/67 | HR 67 | Temp 97.8°F | Resp 18 | Ht 67.0 in | Wt 173.4 lb

## 2020-01-31 DIAGNOSIS — Z923 Personal history of irradiation: Secondary | ICD-10-CM | POA: Diagnosis not present

## 2020-01-31 DIAGNOSIS — Z510 Encounter for antineoplastic radiation therapy: Secondary | ICD-10-CM | POA: Diagnosis not present

## 2020-01-31 DIAGNOSIS — C160 Malignant neoplasm of cardia: Secondary | ICD-10-CM | POA: Insufficient documentation

## 2020-01-31 DIAGNOSIS — Z5111 Encounter for antineoplastic chemotherapy: Secondary | ICD-10-CM | POA: Diagnosis not present

## 2020-01-31 DIAGNOSIS — R339 Retention of urine, unspecified: Secondary | ICD-10-CM | POA: Insufficient documentation

## 2020-01-31 DIAGNOSIS — Z7982 Long term (current) use of aspirin: Secondary | ICD-10-CM | POA: Insufficient documentation

## 2020-01-31 DIAGNOSIS — Z79899 Other long term (current) drug therapy: Secondary | ICD-10-CM | POA: Diagnosis not present

## 2020-01-31 DIAGNOSIS — R12 Heartburn: Secondary | ICD-10-CM | POA: Diagnosis not present

## 2020-01-31 DIAGNOSIS — C155 Malignant neoplasm of lower third of esophagus: Secondary | ICD-10-CM

## 2020-01-31 DIAGNOSIS — I252 Old myocardial infarction: Secondary | ICD-10-CM | POA: Insufficient documentation

## 2020-01-31 LAB — CBC WITH DIFFERENTIAL (CANCER CENTER ONLY)
Abs Immature Granulocytes: 0.03 10*3/uL (ref 0.00–0.07)
Basophils Absolute: 0 10*3/uL (ref 0.0–0.1)
Basophils Relative: 1 %
Eosinophils Absolute: 0 10*3/uL (ref 0.0–0.5)
Eosinophils Relative: 1 %
HCT: 30.1 % — ABNORMAL LOW (ref 39.0–52.0)
Hemoglobin: 9.9 g/dL — ABNORMAL LOW (ref 13.0–17.0)
Immature Granulocytes: 1 %
Lymphocytes Relative: 2 %
Lymphs Abs: 0.1 10*3/uL — ABNORMAL LOW (ref 0.7–4.0)
MCH: 31.3 pg (ref 26.0–34.0)
MCHC: 32.9 g/dL (ref 30.0–36.0)
MCV: 95.3 fL (ref 80.0–100.0)
Monocytes Absolute: 0.2 10*3/uL (ref 0.1–1.0)
Monocytes Relative: 6 %
Neutro Abs: 3.5 10*3/uL (ref 1.7–7.7)
Neutrophils Relative %: 89 %
Platelet Count: 164 10*3/uL (ref 150–400)
RBC: 3.16 MIL/uL — ABNORMAL LOW (ref 4.22–5.81)
RDW: 14.4 % (ref 11.5–15.5)
WBC Count: 3.9 10*3/uL — ABNORMAL LOW (ref 4.0–10.5)
nRBC: 0 % (ref 0.0–0.2)

## 2020-01-31 LAB — CMP (CANCER CENTER ONLY)
ALT: 17 U/L (ref 0–44)
AST: 16 U/L (ref 15–41)
Albumin: 3.1 g/dL — ABNORMAL LOW (ref 3.5–5.0)
Alkaline Phosphatase: 56 U/L (ref 38–126)
Anion gap: 10 (ref 5–15)
BUN: 13 mg/dL (ref 8–23)
CO2: 26 mmol/L (ref 22–32)
Calcium: 8.7 mg/dL — ABNORMAL LOW (ref 8.9–10.3)
Chloride: 101 mmol/L (ref 98–111)
Creatinine: 0.73 mg/dL (ref 0.61–1.24)
GFR, Estimated: >60 mL/min (ref >60)
Glucose, Bld: 199 mg/dL — ABNORMAL HIGH (ref 70–99)
Potassium: 3.6 mmol/L (ref 3.5–5.1)
Sodium: 137 mmol/L (ref 135–145)
Total Bilirubin: 0.5 mg/dL (ref 0.3–1.2)
Total Protein: 6.4 g/dL — ABNORMAL LOW (ref 6.5–8.1)

## 2020-01-31 NOTE — Progress Notes (Signed)
PT STABLE AT TIME OF DISCHARGE 

## 2020-01-31 NOTE — Telephone Encounter (Signed)
Patient notified of the above. Voiced concern about treatment. Patient due for treatment on Thursday.

## 2020-01-31 NOTE — Telephone Encounter (Signed)
-----   Message from Derwood Kaplan, MD sent at 01/31/2020  1:57 PM EDT ----- Regarding: labs Tell them he is more anemic, from treatment, but labs otherwise look good

## 2020-01-31 NOTE — Progress Notes (Signed)
[facility]  [address] [place],  [place]  [address] [phone]  Clinic Day:  01/31/2020  Referring physician: Nicoletta Dress, MD   This document serves as a record of services personally performed by [name], MD. It was created on their behalf by [facility] E, a trained medical scribe. The creation of this record is based on the scribe's personal observations and the provider's statements to them.   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  Barium swallow study from August 4th was consistent with stricture of the distal esophagus and so endoscopy was pursued on August 18th by Dr. Nehemiah Settle.  This revealed a 1.0-1.5 cm mass of at the EG junction.  Surgical pathology was consistent with moderately differentiated adenocarcinoma.  HER2 testing was negative.  CT chest, abdomen and pelvis from August 26th was negative for evidence of metastatic disease.  Soft tissue fullness was seen in the region of the esophagogastric junction, although discrete mass lesion was not visible by CT.  Small to moderate right pleural effusion with right basilar atelectasis was also observed.  Tiny left pulmonary nodules, likely benign, and bilateral renal cysts are noted.  Upper EUS from October 6th revealed a partially obstructing, malignant esophageal tumor was found at the gastroesophageal junction.  It appeared to originate from within the luminal interface/superficial mucosa (Layer 1) and extend in parts through the muscularis propria. A tissue diagnosis was obtained prior to this exam consistent with adenocarcinoma.  He did have a myocardial infarction on June 19th 2021, and had to have coronary angioplasty with 2 stents.  He has  been placed on Brilinta 90 mg daily and continues with cardiac rehabilitation.  He also continues aspirin 81 mg daily.  PET imaging from September 9th revealed moderate FDG uptake in the distal esophagus/GE junction mass consistent with known neoplasm.  No locoregional lymphadenopathy involving the mediastinum or upper abdomen or findings of distant metastatic disease were observed.  He was no longer able to swallow food or medications and relies on his feeding tube.  He had severe constipation despite multiple doses of magnesium citrate and was seenin the emergency room as he developed urinary detention and a catheter was placed.  He followed up with Dr. [name].  He is now using Miralax daily.    INTERVAL HISTORY:  Jeremiah Ray is here for follow up prior to a 3rd dose of paclitaxel/carboplatin scheduled for November 4th.  He states that he has been doing well other than developing a fever following his COVID vaccine booster the other day.  He was admitted for cultures and chest x-ray with no source of infectio and just a small amount of white cells and bacteria in his urine.  Dr. [name] has removed his catheter and his has been placed on bethanechol twice daily.  He is still experiencing some urinary hesitancy.  If this persists, they are to contact Dr. [name] again.  He has been keeping up with his tube feedings, and he has gained 1 and 1/2 pounds since his last visit.  He denies fever, chills or other signs of infection.  He denies nausea, vomiting, bowel issues, or abdominal pain.  He denies sore throat, cough, dyspnea, or chest pain.   REVIEW OF SYSTEMS:  Review of Systems  All other systems reviewed and are negative.    VITALS:  Blood pressure (!) 146/67, pulse 67, temperature 97.8 F (36.6 C), resp. rate 18, height _0  (1.702 m), weight 173 lb 6.4 oz (78.7 kg), SpO2 98 %.  Wt Readings from Last 3 Encounters:  01/31/20 173 lb 6.4 oz (78.7 kg)  01/26/20 172 lb (78 kg)  01/25/20 176 lb 5 oz (80 kg)      Body mass index is 27.16 kg/m.  Performance status (ECOG): 1 - Symptomatic but completely ambulatory  PHYSICAL EXAM:  Physical Exam Constitutional:      Appearance: Normal appearance. He is normal weight.  HENT:     Head: Normocephalic and atraumatic.  Eyes:     General: No scleral icterus.    Extraocular Movements: Extraocular movements intact.     Conjunctiva/sclera: Conjunctivae normal.     Pupils: Pupils are equal, round, and reactive to light.  Cardiovascular:     Rate and Rhythm: Normal rate and regular rhythm.     Pulses: Normal pulses.     Heart sounds: Normal heart sounds. No murmur heard.  No friction rub. No gallop.   Pulmonary:     Effort: Pulmonary effort is normal.     Breath sounds: Normal breath sounds.  Abdominal:     General: Bowel sounds are normal. There is no distension.     Palpations: Abdomen is soft. There is no mass.     Tenderness: There is no abdominal tenderness.     Comments: The feeding tube is functioning well with no inflammation.   His midline upper abdominal incision is also well healed.  Musculoskeletal:        General: Normal range of motion.     Cervical back: Neck supple.     Right lower leg: No edema.     Left lower leg: No edema.  Lymphadenopathy:     Cervical: No cervical adenopathy.  Skin:    General: Skin is warm and dry.  Neurological:     General: No focal deficit present.     Mental Status: He is alert and oriented to person, place, and time. Mental status is at baseline.     LABS:   CBC Latest Ref Rng & Units 01/24/2020 01/06/2017 01/05/2017  WBC - 6.1 10.5 -  Hemoglobin 13.5 - 17.5 11.5(A) 10.4(L) 10.5(L)  Hematocrit 41 - 53 34(A) 31.4(L) 31.0(L)  Platelets 150 - 399 203 189 -   CMP Latest Ref Rng & Units 01/24/2020 01/06/2020 11/28/2019  Glucose 65 - 99 mg/dL - - -  BUN 4 - 21 25(A) - -  Creatinine 0.6 - 1.3 0.8 0.9 0.9  Sodium 137 - 147 134(A) - -  Potassium 3.4 - 5.3 4.1 - -  Chloride 99 - 108 98(A) - -  CO2  13 - 22 26(A) - -  Calcium 8.7 - 10.7 9.1 - -  Alkaline Phos 25 - 125 73 - -  AST 14 - 40 35 - -  ALT 10 - 40 24 - -     STUDIES:  No results found.   Allergies: No Known Allergies  Current Medications: Current Outpatient Medications  Medication Sig Dispense Refill   Acetaminophen Childrens (TYLENOL CHILDRENS CHEWABLES) 160 MG CHEW Chew by mouth.      amiodarone (PACERONE) 200 MG tablet Take 200 mg by mouth daily.      amLODipine (NORVASC) 10 MG tablet Take 10 mg by mouth at bedtime.      aspirin 81  MG chewable tablet Chew 81 mg by mouth daily. Chewable tablet daily instead of the regular tablets     benazepril (LOTENSIN) 40 MG tablet Take 40 mg by mouth at bedtime.      Blood Glucose Monitoring Suppl (TRUE METRIX AIR GLUCOSE METER) w/Device KIT Use as directed.     ferrous sulfate 325 (65 FE) MG tablet Take 325 mg by mouth at bedtime.     glimepiride (AMARYL) 2 MG tablet Take 2 mg by mouth daily with breakfast.     magnesium citrate SOLN Take 296 mLs by mouth once.     metFORMIN (GLUCOPHAGE) 500 MG tablet Take 500 mg by mouth 2 (two) times daily with a meal.     metoprolol succinate (TOPROL-XL) 50 MG 24 hr tablet Take 50 mg by mouth daily.      nitroGLYCERIN (NITROSTAT) 0.4 MG SL tablet Place 0.4 mg under the tongue as needed.     ondansetron (ZOFRAN) 4 MG tablet Take 4 mg by mouth every 8 (eight) hours as needed for nausea or vomiting.     pantoprazole (PROTONIX) 40 MG tablet Take 40 mg by mouth 2 (two) times daily.      polyethylene glycol (MIRALAX / GLYCOLAX) 17 g packet Take 17 g by mouth daily as needed.      prochlorperazine (COMPAZINE) 10 MG tablet Take 10 mg by mouth every 6 (six) hours as needed for nausea or vomiting.     tamsulosin (FLOMAX) 0.4 MG CAPS capsule Take 1 capsule (0.4 mg total) by mouth daily. (Patient taking differently: Take 0.4 mg by mouth at bedtime. ) 30 capsule 1   ticagrelor (BRILINTA) 90 MG TABS tablet Take 90 mg by mouth 2 (two)  times daily.      TRUE METRIX BLOOD GLUCOSE TEST test strip      Ascorbic Acid (VITAMIN C) 1000 MG tablet Take 1,000 mg by mouth daily. (Patient not taking: Reported on 01/31/2020)     atorvastatin (LIPITOR) 80 MG tablet Take 80 mg by mouth at bedtime.  (Patient not taking: Reported on 01/31/2020)     Cholecalciferol (VITAMIN D3 PO) Take 1 tablet by mouth at bedtime. (Patient not taking: Reported on 01/31/2020)     furosemide (LASIX) 20 MG tablet Take 20 mg by mouth daily as needed for fluid.  (Patient not taking: Reported on 01/31/2020)     Multiple Vitamins-Minerals (B COMPLEX-C-E-ZINC) tablet Take 1 tablet by mouth daily. (Patient not taking: Reported on 01/31/2020)     Omega-3 Fatty Acids (FISH OIL) 1000 MG CAPS Take by mouth. (Patient not taking: Reported on 01/31/2020)     No current facility-administered medications for this visit.     ASSESSMENT & PLAN:   Assessment:   1.  Stage III (T3N0M0) adenocarcinoma of the GE junction.  This mass measures 1.5 cm.  CT imaging was negative for obvious metastatic disease, and a PET was helpful for accurate staging, especially in view of the pleural effusion.  Testing for HER2 was negative.    2.  History of myocardial infarction back in June 2021.  He required coronary angioplasty with 2 stents.  He has been placed on Brilinta 90 mg daily, which he has been recommended to continue for at least 1 year.  He also continues with cardiac rehabilitation.   3.  History of blood clots of the left leg.  He continues aspirin 81 mg daily.  4.  Mild right pleural effusion.  Thoracentesis yielded 1 L of fluid consistent with reactive  mesothelial cells.   5. Anemia.  Evaluation was consistent with iron deficiency and he is on oral supplement.   6. Feeding tube placed due to dysphagia.  He continues regular feedings and fluid intake.  Oral medications are also taken through the feeding tube.  I have recommended that he increase his water intake through the  tube as well.  7.  Urinary retention and hesitancy.  His catheter was removed by Dr. [name] and he has been placed on bethanechol twice daily.  8.  Severe constipation, resolved after a series of enemas.  He continues Miralax daily now.  We reviewed a bowel regimen if this is to recur.     Plan: He will proceed with his 3rd cycle of weekly paclitaxel/carboplatin on Thursday as scheduled.  He will continue daily radiation.  We will see him back in 1 week with CBC and CMP for reevaluation prior to a 4th cycle of therapy.  He and his wife understand and agree with this plan of care.  I have answered their questions, and they know to call with any concerns.   I provided 20 minutes (10:08 - 10:28 AM) of face-to-face time during this this encounter and > 50% was spent counseling as documented under my assessment and plan.    Derwood Kaplan, MD [facility] [place]   I, Rita Ohara, am acting as scribe for Derwood Kaplan, MD  I have reviewed this report as typed by the medical scribe, and it is complete and accurate.

## 2020-02-01 DIAGNOSIS — Z510 Encounter for antineoplastic radiation therapy: Secondary | ICD-10-CM | POA: Diagnosis not present

## 2020-02-01 DIAGNOSIS — C155 Malignant neoplasm of lower third of esophagus: Secondary | ICD-10-CM | POA: Diagnosis not present

## 2020-02-02 ENCOUNTER — Ambulatory Visit: Payer: Medicare HMO

## 2020-02-02 ENCOUNTER — Inpatient Hospital Stay: Payer: Medicare HMO

## 2020-02-02 ENCOUNTER — Other Ambulatory Visit: Payer: Self-pay

## 2020-02-02 VITALS — BP 122/59 | HR 69 | Temp 97.8°F | Resp 18 | Ht 63.0 in | Wt 170.2 lb

## 2020-02-02 DIAGNOSIS — C155 Malignant neoplasm of lower third of esophagus: Secondary | ICD-10-CM | POA: Diagnosis not present

## 2020-02-02 DIAGNOSIS — R339 Retention of urine, unspecified: Secondary | ICD-10-CM | POA: Diagnosis not present

## 2020-02-02 DIAGNOSIS — Z7982 Long term (current) use of aspirin: Secondary | ICD-10-CM | POA: Diagnosis not present

## 2020-02-02 DIAGNOSIS — Z510 Encounter for antineoplastic radiation therapy: Secondary | ICD-10-CM | POA: Diagnosis not present

## 2020-02-02 DIAGNOSIS — Z5111 Encounter for antineoplastic chemotherapy: Secondary | ICD-10-CM | POA: Diagnosis not present

## 2020-02-02 DIAGNOSIS — I252 Old myocardial infarction: Secondary | ICD-10-CM | POA: Diagnosis not present

## 2020-02-02 DIAGNOSIS — Z923 Personal history of irradiation: Secondary | ICD-10-CM | POA: Diagnosis not present

## 2020-02-02 DIAGNOSIS — R12 Heartburn: Secondary | ICD-10-CM | POA: Diagnosis not present

## 2020-02-02 DIAGNOSIS — Z79899 Other long term (current) drug therapy: Secondary | ICD-10-CM | POA: Diagnosis not present

## 2020-02-02 DIAGNOSIS — C160 Malignant neoplasm of cardia: Secondary | ICD-10-CM | POA: Diagnosis not present

## 2020-02-02 MED ORDER — SODIUM CHLORIDE 0.9 % IV SOLN
192.4000 mg | Freq: Once | INTRAVENOUS | Status: AC
  Administered 2020-02-02: 190 mg via INTRAVENOUS
  Filled 2020-02-02: qty 19

## 2020-02-02 MED ORDER — SODIUM CHLORIDE 0.9 % IV SOLN
Freq: Once | INTRAVENOUS | Status: AC
  Filled 2020-02-02: qty 250

## 2020-02-02 MED ORDER — DIPHENHYDRAMINE HCL 50 MG/ML IJ SOLN
12.5000 mg | Freq: Once | INTRAMUSCULAR | Status: AC
  Administered 2020-02-02: 12.5 mg via INTRAVENOUS

## 2020-02-02 MED ORDER — PALONOSETRON HCL INJECTION 0.25 MG/5ML
0.2500 mg | Freq: Once | INTRAVENOUS | Status: AC
  Administered 2020-02-02: 0.25 mg via INTRAVENOUS

## 2020-02-02 MED ORDER — PALONOSETRON HCL INJECTION 0.25 MG/5ML
INTRAVENOUS | Status: AC
  Filled 2020-02-02: qty 5

## 2020-02-02 MED ORDER — SODIUM CHLORIDE 0.9 % IV SOLN
10.0000 mg | Freq: Once | INTRAVENOUS | Status: AC
  Administered 2020-02-02: 10 mg via INTRAVENOUS
  Filled 2020-02-02: qty 10

## 2020-02-02 MED ORDER — DIPHENHYDRAMINE HCL 50 MG/ML IJ SOLN
INTRAMUSCULAR | Status: AC
  Filled 2020-02-02: qty 1

## 2020-02-02 MED ORDER — SODIUM CHLORIDE 0.9 % IV SOLN
50.0000 mg/m2 | Freq: Once | INTRAVENOUS | Status: AC
  Administered 2020-02-02: 96 mg via INTRAVENOUS
  Filled 2020-02-02: qty 16

## 2020-02-02 MED ORDER — SODIUM CHLORIDE 0.9% FLUSH
10.0000 mL | INTRAVENOUS | Status: DC | PRN
  Administered 2020-02-02: 10 mL
  Filled 2020-02-02: qty 10

## 2020-02-02 MED ORDER — FAMOTIDINE IN NACL 20-0.9 MG/50ML-% IV SOLN
INTRAVENOUS | Status: AC
  Filled 2020-02-02: qty 50

## 2020-02-02 MED ORDER — FAMOTIDINE IN NACL 20-0.9 MG/50ML-% IV SOLN
20.0000 mg | Freq: Once | INTRAVENOUS | Status: AC
  Administered 2020-02-02: 20 mg via INTRAVENOUS

## 2020-02-02 MED ORDER — HEPARIN SOD (PORK) LOCK FLUSH 100 UNIT/ML IV SOLN
500.0000 [IU] | Freq: Once | INTRAVENOUS | Status: AC | PRN
  Administered 2020-02-02: 500 [IU]
  Filled 2020-02-02: qty 5

## 2020-02-02 NOTE — Progress Notes (Signed)
PT STABLE AT TIME OF DISCHARGE 

## 2020-02-03 ENCOUNTER — Ambulatory Visit: Payer: Medicare HMO

## 2020-02-03 DIAGNOSIS — Z510 Encounter for antineoplastic radiation therapy: Secondary | ICD-10-CM | POA: Diagnosis not present

## 2020-02-03 DIAGNOSIS — C155 Malignant neoplasm of lower third of esophagus: Secondary | ICD-10-CM | POA: Diagnosis not present

## 2020-02-06 ENCOUNTER — Other Ambulatory Visit: Payer: Medicare HMO

## 2020-02-06 ENCOUNTER — Ambulatory Visit: Payer: Medicare HMO | Admitting: Oncology

## 2020-02-06 DIAGNOSIS — Z510 Encounter for antineoplastic radiation therapy: Secondary | ICD-10-CM | POA: Diagnosis not present

## 2020-02-06 DIAGNOSIS — C155 Malignant neoplasm of lower third of esophagus: Secondary | ICD-10-CM | POA: Diagnosis not present

## 2020-02-06 NOTE — Progress Notes (Signed)
[place]  [address] [place],  [place]  [address] [phone]  Clinic Day:  02/07/2020  Referring physician: Nicoletta Dress, MD   This document serves as a record of services personally performed by [name], MD. It was created on their behalf by [facility] E, a trained medical scribe. The creation of this record is based on the scribe's personal observations and the provider's statements to them.   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  Barium swallow study from August 4th was consistent with stricture of the distal esophagus and so endoscopy was pursued on August 18th by Dr. Nehemiah Settle.  This revealed a 1.0-1.5 cm mass of at the EG junction.  Surgical pathology was consistent with moderately differentiated adenocarcinoma.  HER2 testing was negative.  CT chest, abdomen and pelvis from August 26th was negative for evidence of metastatic disease.  Soft tissue fullness was seen in the region of the esophagogastric junction, although discrete mass lesion was not visible by CT.  Small to moderate right pleural effusion with right basilar atelectasis was also observed.  Tiny left pulmonary nodules, likely benign, and bilateral renal cysts are noted.  Upper EUS from October 6th revealed a partially obstructing, malignant esophageal tumor was found at the gastroesophageal junction.  It appeared to originate from within the luminal interface/superficial mucosa (Layer 1) and extend in parts through the muscularis propria. A tissue diagnosis was obtained prior to this exam consistent with adenocarcinoma.  He did have a myocardial infarction on June 19th 2021, and had to have coronary angioplasty with 2 stents.  He has  been placed on Brilinta 90 mg daily and continues with cardiac rehabilitation.  He also continues aspirin 81 mg daily.  PET imaging from September 9th revealed moderate FDG uptake in the distal esophagus/GE junction mass consistent with known neoplasm.  No locoregional lymphadenopathy involving the mediastinum or upper abdomen or findings of distant metastatic disease were observed.  He was no longer able to swallow food or medications and relies on his feeding tube.  He had severe constipation despite multiple doses of magnesium citrate and was seenin the emergency room as he developed urinary detention and a catheter was placed.  He followed up with Dr. [name].  He is now using Miralax daily.    INTERVAL HISTORY:  He is here for follow up prior to a 4th dose of weekly paclitaxel/carboplatin.  He notes worsening heart burn and is on pantoprazole 40 mg BID but has also been taking Pepcid twice daily.  This is likely due to esophagitis from radiation, so I will place him on Carafate QID prn.  He is scheduled to complete radiation on November 20th.  He has had some mild epistaxis after blowing his nose for the past 3-4 days.  He reports moderately loose stools.  His white count has decreased from 3.9 to 3.6 with an [place] of 3310, his hemoglobin has decreased from 11.5 to 10.1, and his platelet count is normal.  Chemistries are unremarkable.  His  appetite is good, and his weight is stable since his last visit.  He continues tube feedings 4 times daily.  He denies fever, chills or other signs of infection.  He denies nausea, vomiting, bowel issues, or abdominal pain.  He denies sore throat, cough, dyspnea, or chest pain.   REVIEW OF SYSTEMS:  Review of Systems  HENT:   Positive for nosebleeds (mild).   Gastrointestinal:       Worsening heart burn  All other systems reviewed and are negative.    VITALS:  Blood pressure 130/60, pulse 66, temperature 97.8 F (36.6 C), temperature source Oral, resp. rate 18,  height _0  (1.6 m), weight 169 lb 12.8 oz (77 kg), SpO2 98 %.  Wt Readings from Last 3 Encounters:  02/07/20 169 lb 12.8 oz (77 kg)  02/02/20 170 lb 3 oz (77.2 kg)  01/31/20 173 lb 1 oz (78.5 kg)    Body mass index is 30.08 kg/m.  Performance status (ECOG): 1 - Symptomatic but completely ambulatory  PHYSICAL EXAM:  Physical Exam Constitutional:      Appearance: Normal appearance. He is normal weight.  HENT:     Head: Normocephalic and atraumatic.  Eyes:     General: No scleral icterus.    Extraocular Movements: Extraocular movements intact.     Conjunctiva/sclera: Conjunctivae normal.     Pupils: Pupils are equal, round, and reactive to light.  Cardiovascular:     Rate and Rhythm: Normal rate and regular rhythm.     Pulses: Normal pulses.     Heart sounds: Normal heart sounds. No murmur heard.  No friction rub. No gallop.   Pulmonary:     Effort: Pulmonary effort is normal.     Breath sounds: Normal breath sounds.  Abdominal:     General: Bowel sounds are normal. There is no distension.     Palpations: Abdomen is soft. There is no mass.     Tenderness: There is no abdominal tenderness.     Comments: The feeding tube is functioning well with no inflammation.  Musculoskeletal:        General: Normal range of motion.     Cervical back: Normal range of motion and neck supple.     Right lower leg: No edema.     Left lower leg: No edema.  Lymphadenopathy:     Cervical: No cervical adenopathy.  Skin:    General: Skin is warm and dry.  Neurological:     General: No focal deficit present.     Mental Status: He is alert and oriented to person, place, and time. Mental status is at baseline.  Psychiatric:        Mood and Affect: Mood normal.        Behavior: Behavior normal.        Thought Content: Thought content normal.        Judgment: Judgment normal.     LABS:   His white count has decreased from 3.9 to 3.6 with an ANC of 3310, his hemoglobin has decreased from  11.5 to 10.1, and his platelet count is normal.  Chemistries are unremarkable.  CBC Latest Ref Rng & Units 01/31/2020 01/24/2020 01/06/2017  WBC 4.0 - 10.5 K/uL 3.9(L) 6.1 10.5  Hemoglobin 13.0 - 17.0 g/dL 9.9(L) 11.5(A) 10.4(L)  Hematocrit 39 - 52 % 30.1(L) 34(A) 31.4(L)  Platelets 150 - 400 K/uL 164 203 189   CMP Latest Ref Rng & Units 01/31/2020 01/24/2020 01/06/2020  Glucose 70 - 99 mg/dL 199(H) - -  BUN 8 - 23 mg/dL 13 25(A) -  Creatinine 0.61 - 1.24 mg/dL 0.73 0.8 0.9  Sodium 135 - 145 mmol/L 137 134(A) -  Potassium 3.5 - 5.1 mmol/L 3.6 4.1 -  Chloride 98 -  111 mmol/L 101 98(A) -  CO2 22 - 32 mmol/L 26 26(A) -  Calcium 8.9 - 10.3 mg/dL 8.7(L) 9.1 -  Total Protein 6.5 - 8.1 g/dL 6.4(L) - -  Total Bilirubin 0.3 - 1.2 mg/dL 0.5 - -  Alkaline Phos 38 - 126 U/L 56 73 -  AST 15 - 41 U/L 16 35 -  ALT 0 - 44 U/L 17 24 -     STUDIES:  No results found.   Allergies: No Known Allergies  Current Medications: Current Outpatient Medications  Medication Sig Dispense Refill  . acetaminophen-codeine 120-12 MG/5ML solution     . Acetaminophen Childrens (TYLENOL CHILDRENS CHEWABLES) 160 MG CHEW Chew by mouth.     Marland Kitchen amiodarone (PACERONE) 200 MG tablet Take 200 mg by mouth daily.     Marland Kitchen amLODipine (NORVASC) 10 MG tablet Take 10 mg by mouth at bedtime.     . Ascorbic Acid (VITAMIN C) 1000 MG tablet Take 1,000 mg by mouth daily. (Patient not taking: Reported on 01/31/2020)    . aspirin 81 MG chewable tablet Chew 81 mg by mouth daily. Chewable tablet daily instead of the regular tablets    . atorvastatin (LIPITOR) 80 MG tablet Take 80 mg by mouth at bedtime.  (Patient not taking: Reported on 01/31/2020)    . benazepril (LOTENSIN) 40 MG tablet Take 40 mg by mouth at bedtime.     . bethanechol (URECHOLINE) 25 MG tablet     . Blood Glucose Monitoring Suppl (TRUE METRIX AIR GLUCOSE METER) w/Device KIT Use as directed.    . Cholecalciferol (VITAMIN D3 PO) Take 1 tablet by mouth at bedtime. (Patient not  taking: Reported on 01/31/2020)    . ferrous sulfate 325 (65 FE) MG tablet Take 325 mg by mouth at bedtime.    . furosemide (LASIX) 20 MG tablet Take 20 mg by mouth daily as needed for fluid.  (Patient not taking: Reported on 01/31/2020)    . glimepiride (AMARYL) 2 MG tablet Take 2 mg by mouth daily with breakfast.    . magnesium citrate SOLN Take 296 mLs by mouth once.    . metFORMIN (GLUCOPHAGE) 500 MG tablet Take 500 mg by mouth 2 (two) times daily with a meal.    . metoCLOPramide (REGLAN) 10 MG tablet Take 10 mg by mouth 4 (four) times daily.    . metoprolol succinate (TOPROL-XL) 50 MG 24 hr tablet Take 50 mg by mouth daily.     . Multiple Vitamins-Minerals (B COMPLEX-C-E-ZINC) tablet Take 1 tablet by mouth daily. (Patient not taking: Reported on 01/31/2020)    . nitroGLYCERIN (NITROSTAT) 0.4 MG SL tablet Place 0.4 mg under the tongue as needed.    . Omega-3 Fatty Acids (FISH OIL) 1000 MG CAPS Take by mouth. (Patient not taking: Reported on 01/31/2020)    . ondansetron (ZOFRAN) 4 MG tablet Take 4 mg by mouth every 8 (eight) hours as needed for nausea or vomiting.    . ondansetron (ZOFRAN-ODT) 4 MG disintegrating tablet     . oxyCODONE (OXY IR/ROXICODONE) 5 MG immediate release tablet     . pantoprazole (PROTONIX) 40 MG tablet Take 40 mg by mouth 2 (two) times daily.     . polyethylene glycol (MIRALAX / GLYCOLAX) 17 g packet Take 17 g by mouth daily as needed.     . prochlorperazine (COMPAZINE) 10 MG tablet Take 10 mg by mouth every 6 (six) hours as needed for nausea or vomiting.    . tamsulosin (FLOMAX) 0.4  MG CAPS capsule Take 1 capsule (0.4 mg total) by mouth daily. (Patient taking differently: Take 0.4 mg by mouth at bedtime. ) 30 capsule 1  . ticagrelor (BRILINTA) 90 MG TABS tablet Take 90 mg by mouth 2 (two) times daily.     . TRUE METRIX BLOOD GLUCOSE TEST test strip     . zinc gluconate 50 MG tablet Take 50 mg by mouth daily.     No current facility-administered medications for this  visit.     ASSESSMENT & PLAN:   Assessment:   1.  Stage III (T3N0M0) adenocarcinoma of the GE junction.  This mass measures 1.5 cm.  CT imaging was negative for obvious metastatic disease, and a PET was helpful for accurate staging, especially in view of the pleural effusion.  Testing for HER2 was negative.    2.  History of myocardial infarction back in June 2021.  He required coronary angioplasty with 2 stents.  He has been placed on Brilinta 90 mg daily, which he has been recommended to continue for at least 1 year.  He also continues with cardiac rehabilitation.   3.  History of blood clots of the left leg.  He continues aspirin 81 mg daily.  4.  Mild right pleural effusion.  Thoracentesis yielded 1 L of fluid consistent with reactive mesothelial cells.   5. Anemia.  Evaluation was consistent with iron deficiency and he is on oral supplement.   6. Feeding tube placed due to dysphagia.  He continues regular feedings and fluid intake.  Oral medications are also taken through the feeding tube.  I have recommended that he increase his water intake through the tube as well.  7.  Urinary retention and hesitancy.  His catheter was removed by Dr. [name] and he has been placed on bethanechol twice daily.  8.  Severe constipation, resolved after a series of enemas.  He continues Miralax daily now.  We reviewed a bowel regimen if this is to recur.    9.   Worsening heart burn likely due to esophagitis from radiation.  I will add in Carafate slurry 1g QID in addition to his other antacids.   Plan: He will proceed with his 4th cycle of weekly paclitaxel/carboplatin on Thursday as scheduled.  We will plan for 6th cycles unless he experiences significant toxicities.  He will continue daily radiation and is scheduled to complete this on November 20th.  I will send in a prescription for Carafate 1 g QID prn.  We will see him back in 1 week with CBC and CMP for reevaluation prior to a 5th cycle of  therapy.  He will return on Monday November 22nd for CBC and CMP prior to a 6th and final dose of chemotherapy on November 24th.  He and his wife understand and agree with this plan of care.  I have answered their questions, and they know to call with any concerns.   I provided 25 minutes (9:35 AM - 10:00 AM) of face-to-face time during this this encounter and > 50% was spent counseling as documented under my assessment and plan.    Derwood Kaplan, MD [facility] [place]   I, Rita Ohara, am acting as scribe for Derwood Kaplan, MD  I have reviewed this report as typed by the medical scribe, and it is complete and accurate.

## 2020-02-07 ENCOUNTER — Inpatient Hospital Stay: Payer: Medicare HMO | Admitting: Hematology and Oncology

## 2020-02-07 ENCOUNTER — Other Ambulatory Visit: Payer: Self-pay | Admitting: Oncology

## 2020-02-07 ENCOUNTER — Other Ambulatory Visit: Payer: Self-pay

## 2020-02-07 ENCOUNTER — Inpatient Hospital Stay (INDEPENDENT_AMBULATORY_CARE_PROVIDER_SITE_OTHER): Payer: Medicare HMO | Admitting: Oncology

## 2020-02-07 ENCOUNTER — Encounter: Payer: Self-pay | Admitting: Oncology

## 2020-02-07 VITALS — BP 130/60 | HR 66 | Temp 97.8°F | Resp 18 | Ht 63.0 in | Wt 169.8 lb

## 2020-02-07 DIAGNOSIS — C155 Malignant neoplasm of lower third of esophagus: Secondary | ICD-10-CM

## 2020-02-07 DIAGNOSIS — D649 Anemia, unspecified: Secondary | ICD-10-CM | POA: Diagnosis not present

## 2020-02-07 DIAGNOSIS — Z0001 Encounter for general adult medical examination with abnormal findings: Secondary | ICD-10-CM | POA: Diagnosis not present

## 2020-02-07 DIAGNOSIS — Z510 Encounter for antineoplastic radiation therapy: Secondary | ICD-10-CM | POA: Diagnosis not present

## 2020-02-07 DIAGNOSIS — A419 Sepsis, unspecified organism: Secondary | ICD-10-CM | POA: Diagnosis not present

## 2020-02-07 DIAGNOSIS — R509 Fever, unspecified: Secondary | ICD-10-CM | POA: Diagnosis not present

## 2020-02-07 DIAGNOSIS — Z23 Encounter for immunization: Secondary | ICD-10-CM | POA: Diagnosis not present

## 2020-02-07 LAB — HEPATIC FUNCTION PANEL
ALT: 27 (ref 10–40)
AST: 35 (ref 14–40)
Alkaline Phosphatase: 64 (ref 25–125)
Bilirubin, Total: 0.5

## 2020-02-07 LAB — COMPREHENSIVE METABOLIC PANEL
Albumin: 3.4 — AB (ref 3.5–5.0)
Calcium: 8.8 (ref 8.7–10.7)

## 2020-02-07 LAB — BASIC METABOLIC PANEL
BUN: 15 (ref 4–21)
CO2: 28 — AB (ref 13–22)
Chloride: 97 — AB (ref 99–108)
Creatinine: 0.8 (ref 0.6–1.3)
Glucose: 198
Potassium: 4.1 (ref 3.4–5.3)
Sodium: 136 — AB (ref 137–147)

## 2020-02-07 LAB — CBC AND DIFFERENTIAL
HCT: 31 — AB (ref 41–53)
Hemoglobin: 10.1 — AB (ref 13.5–17.5)
Neutrophils Absolute: 3.31
Platelets: 212 (ref 150–399)
WBC: 3.6

## 2020-02-07 LAB — CBC: RBC: 3.38 — AB (ref 3.87–5.11)

## 2020-02-07 MED ORDER — SUCRALFATE 1 G PO TABS: 1.0000 g | ORAL_TABLET | Freq: Four times a day (QID) | ORAL | 2 refills | Status: DC

## 2020-02-08 DIAGNOSIS — C155 Malignant neoplasm of lower third of esophagus: Secondary | ICD-10-CM | POA: Diagnosis not present

## 2020-02-08 DIAGNOSIS — Z510 Encounter for antineoplastic radiation therapy: Secondary | ICD-10-CM | POA: Diagnosis not present

## 2020-02-09 ENCOUNTER — Other Ambulatory Visit: Payer: Self-pay

## 2020-02-09 ENCOUNTER — Ambulatory Visit: Payer: Medicare HMO

## 2020-02-09 ENCOUNTER — Inpatient Hospital Stay: Payer: Medicare HMO

## 2020-02-09 ENCOUNTER — Telehealth: Payer: Self-pay | Admitting: Oncology

## 2020-02-09 VITALS — BP 139/62 | HR 67 | Temp 98.6°F | Resp 18 | Ht 63.0 in | Wt 171.0 lb

## 2020-02-09 DIAGNOSIS — C155 Malignant neoplasm of lower third of esophagus: Secondary | ICD-10-CM | POA: Diagnosis not present

## 2020-02-09 DIAGNOSIS — Z510 Encounter for antineoplastic radiation therapy: Secondary | ICD-10-CM | POA: Diagnosis not present

## 2020-02-09 DIAGNOSIS — Z79899 Other long term (current) drug therapy: Secondary | ICD-10-CM | POA: Diagnosis not present

## 2020-02-09 DIAGNOSIS — R12 Heartburn: Secondary | ICD-10-CM | POA: Diagnosis not present

## 2020-02-09 DIAGNOSIS — Z5111 Encounter for antineoplastic chemotherapy: Secondary | ICD-10-CM | POA: Diagnosis not present

## 2020-02-09 DIAGNOSIS — C160 Malignant neoplasm of cardia: Secondary | ICD-10-CM | POA: Diagnosis not present

## 2020-02-09 DIAGNOSIS — Z923 Personal history of irradiation: Secondary | ICD-10-CM | POA: Diagnosis not present

## 2020-02-09 DIAGNOSIS — R339 Retention of urine, unspecified: Secondary | ICD-10-CM | POA: Diagnosis not present

## 2020-02-09 DIAGNOSIS — I252 Old myocardial infarction: Secondary | ICD-10-CM | POA: Diagnosis not present

## 2020-02-09 DIAGNOSIS — Z7982 Long term (current) use of aspirin: Secondary | ICD-10-CM | POA: Diagnosis not present

## 2020-02-09 MED ORDER — PALONOSETRON HCL INJECTION 0.25 MG/5ML
INTRAVENOUS | Status: AC
  Filled 2020-02-09: qty 5

## 2020-02-09 MED ORDER — SODIUM CHLORIDE 0.9% FLUSH
10.0000 mL | INTRAVENOUS | Status: DC | PRN
  Administered 2020-02-09: 10 mL
  Filled 2020-02-09: qty 10

## 2020-02-09 MED ORDER — DIPHENHYDRAMINE HCL 50 MG/ML IJ SOLN
INTRAMUSCULAR | Status: AC
  Filled 2020-02-09: qty 1

## 2020-02-09 MED ORDER — SODIUM CHLORIDE 0.9 % IV SOLN
10.0000 mg | Freq: Once | INTRAVENOUS | Status: AC
  Administered 2020-02-09: 10 mg via INTRAVENOUS
  Filled 2020-02-09: qty 10

## 2020-02-09 MED ORDER — FAMOTIDINE IN NACL 20-0.9 MG/50ML-% IV SOLN
INTRAVENOUS | Status: AC
  Filled 2020-02-09: qty 50

## 2020-02-09 MED ORDER — SODIUM CHLORIDE 0.9 % IV SOLN
192.4000 mg | Freq: Once | INTRAVENOUS | Status: AC
  Administered 2020-02-09: 190 mg via INTRAVENOUS
  Filled 2020-02-09: qty 19

## 2020-02-09 MED ORDER — DIPHENHYDRAMINE HCL 50 MG/ML IJ SOLN
12.5000 mg | Freq: Once | INTRAMUSCULAR | Status: AC
  Administered 2020-02-09: 12.5 mg via INTRAVENOUS

## 2020-02-09 MED ORDER — FAMOTIDINE IN NACL 20-0.9 MG/50ML-% IV SOLN
20.0000 mg | Freq: Once | INTRAVENOUS | Status: AC
  Administered 2020-02-09: 20 mg via INTRAVENOUS

## 2020-02-09 MED ORDER — SODIUM CHLORIDE 0.9 % IV SOLN
Freq: Once | INTRAVENOUS | Status: AC
  Filled 2020-02-09: qty 250

## 2020-02-09 MED ORDER — HEPARIN SOD (PORK) LOCK FLUSH 100 UNIT/ML IV SOLN
500.0000 [IU] | Freq: Once | INTRAVENOUS | Status: AC | PRN
  Administered 2020-02-09: 500 [IU]
  Filled 2020-02-09: qty 5

## 2020-02-09 MED ORDER — SODIUM CHLORIDE 0.9 % IV SOLN
50.0000 mg/m2 | Freq: Once | INTRAVENOUS | Status: AC
  Administered 2020-02-09: 96 mg via INTRAVENOUS
  Filled 2020-02-09 (×3): qty 16

## 2020-02-09 MED ORDER — PALONOSETRON HCL INJECTION 0.25 MG/5ML
0.2500 mg | Freq: Once | INTRAVENOUS | Status: AC
  Administered 2020-02-09: 0.25 mg via INTRAVENOUS

## 2020-02-09 NOTE — Patient Instructions (Signed)
Paclitaxel injection What is this medicine? PACLITAXEL (PAK li TAX el) is a chemotherapy drug. It targets fast dividing cells, like cancer cells, and causes these cells to die. This medicine is used to treat ovarian cancer, breast cancer, lung cancer, Kaposi's sarcoma, and other cancers. This medicine may be used for other purposes; ask your health care provider or pharmacist if you have questions. COMMON BRAND NAME(S): Onxol, Taxol What should I tell my health care provider before I take this medicine? They need to know if you have any of these conditions:  history of irregular heartbeat  liver disease  low blood counts, like low white cell, platelet, or red cell counts  lung or breathing disease, like asthma  tingling of the fingers or toes, or other nerve disorder  an unusual or allergic reaction to paclitaxel, alcohol, polyoxyethylated castor oil, other chemotherapy, other medicines, foods, dyes, or preservatives  pregnant or trying to get pregnant  breast-feeding How should I use this medicine? This drug is given as an infusion into a vein. It is administered in a hospital or clinic by a specially trained health care professional. Talk to your pediatrician regarding the use of this medicine in children. Special care may be needed. Overdosage: If you think you have taken too much of this medicine contact a poison control center or emergency room at once. NOTE: This medicine is only for you. Do not share this medicine with others. What if I miss a dose? It is important not to miss your dose. Call your doctor or health care professional if you are unable to keep an appointment. What may interact with this medicine? Do not take this medicine with any of the following medications:  disulfiram  metronidazole This medicine may also interact with the following medications:  antiviral medicines for hepatitis, HIV or AIDS  certain antibiotics like erythromycin and  clarithromycin  certain medicines for fungal infections like ketoconazole and itraconazole  certain medicines for seizures like carbamazepine, phenobarbital, phenytoin  gemfibrozil  nefazodone  rifampin  St. John's wort This list may not describe all possible interactions. Give your health care provider a list of all the medicines, herbs, non-prescription drugs, or dietary supplements you use. Also tell them if you smoke, drink alcohol, or use illegal drugs. Some items may interact with your medicine. What should I watch for while using this medicine? Your condition will be monitored carefully while you are receiving this medicine. You will need important blood work done while you are taking this medicine. This medicine can cause serious allergic reactions. To reduce your risk you will need to take other medicine(s) before treatment with this medicine. If you experience allergic reactions like skin rash, itching or hives, swelling of the face, lips, or tongue, tell your doctor or health care professional right away. In some cases, you may be given additional medicines to help with side effects. Follow all directions for their use. This drug may make you feel generally unwell. This is not uncommon, as chemotherapy can affect healthy cells as well as cancer cells. Report any side effects. Continue your course of treatment even though you feel ill unless your doctor tells you to stop. Call your doctor or health care professional for advice if you get a fever, chills or sore throat, or other symptoms of a cold or flu. Do not treat yourself. This drug decreases your body's ability to fight infections. Try to avoid being around people who are sick. This medicine may increase your risk to bruise   or bleed. Call your doctor or health care professional if you notice any unusual bleeding. Be careful brushing and flossing your teeth or using a toothpick because you may get an infection or bleed more easily.  If you have any dental work done, tell your dentist you are receiving this medicine. Avoid taking products that contain aspirin, acetaminophen, ibuprofen, naproxen, or ketoprofen unless instructed by your doctor. These medicines may hide a fever. Do not become pregnant while taking this medicine. Women should inform their doctor if they wish to become pregnant or think they might be pregnant. There is a potential for serious side effects to an unborn child. Talk to your health care professional or pharmacist for more information. Do not breast-feed an infant while taking this medicine. Men are advised not to father a child while receiving this medicine. This product may contain alcohol. Ask your pharmacist or healthcare provider if this medicine contains alcohol. Be sure to tell all healthcare providers you are taking this medicine. Certain medicines, like metronidazole and disulfiram, can cause an unpleasant reaction when taken with alcohol. The reaction includes flushing, headache, nausea, vomiting, sweating, and increased thirst. The reaction can last from 30 minutes to several hours. What side effects may I notice from receiving this medicine? Side effects that you should report to your doctor or health care professional as soon as possible:  allergic reactions like skin rash, itching or hives, swelling of the face, lips, or tongue  breathing problems  changes in vision  fast, irregular heartbeat  high or low blood pressure  mouth sores  pain, tingling, numbness in the hands or feet  signs of decreased platelets or bleeding - bruising, pinpoint red spots on the skin, black, tarry stools, blood in the urine  signs of decreased red blood cells - unusually weak or tired, feeling faint or lightheaded, falls  signs of infection - fever or chills, cough, sore throat, pain or difficulty passing urine  signs and symptoms of liver injury like dark yellow or brown urine; general ill feeling or  flu-like symptoms; light-colored stools; loss of appetite; nausea; right upper belly pain; unusually weak or tired; yellowing of the eyes or skin  swelling of the ankles, feet, hands  unusually slow heartbeat Side effects that usually do not require medical attention (report to your doctor or health care professional if they continue or are bothersome):  diarrhea  hair loss  loss of appetite  muscle or joint pain  nausea, vomiting  pain, redness, or irritation at site where injected  tiredness This list may not describe all possible side effects. Call your doctor for medical advice about side effects. You may report side effects to FDA at 1-800-FDA-1088. Where should I keep my medicine? This drug is given in a hospital or clinic and will not be stored at home. NOTE: This sheet is a summary. It may not cover all possible information. If you have questions about this medicine, talk to your doctor, pharmacist, or health care provider.  2020 Elsevier/Gold Standard (2016-11-18 13:14:55)  

## 2020-02-09 NOTE — Progress Notes (Signed)
PT STABLE AT TIME OF DISCHARGE 

## 2020-02-09 NOTE — Telephone Encounter (Signed)
Scheduled Patient's Lab Appt per 11/9 LOS. Gave patient updated Calendar 11/11

## 2020-02-10 ENCOUNTER — Ambulatory Visit: Payer: Medicare HMO

## 2020-02-10 DIAGNOSIS — C155 Malignant neoplasm of lower third of esophagus: Secondary | ICD-10-CM | POA: Diagnosis not present

## 2020-02-10 DIAGNOSIS — Z510 Encounter for antineoplastic radiation therapy: Secondary | ICD-10-CM | POA: Diagnosis not present

## 2020-02-13 ENCOUNTER — Other Ambulatory Visit: Payer: Self-pay | Admitting: Hematology and Oncology

## 2020-02-13 DIAGNOSIS — C155 Malignant neoplasm of lower third of esophagus: Secondary | ICD-10-CM | POA: Diagnosis not present

## 2020-02-13 DIAGNOSIS — Z510 Encounter for antineoplastic radiation therapy: Secondary | ICD-10-CM | POA: Diagnosis not present

## 2020-02-13 NOTE — Progress Notes (Signed)
[facility]  [address] 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  Barium swallow study from August 4th was consistent with stricture of the distal esophagus and so endoscopy was pursued on August 18th by Dr. Nehemiah Settle.  This revealed a 1.0-1.5 cm mass of at the EG junction.  Surgical pathology was consistent with moderately differentiated adenocarcinoma.  HER2 testing was negative.  CT chest, abdomen and pelvis from August 26th was negative for evidence of metastatic disease.  Soft tissue fullness was seen in the region of the esophagogastric junction, although discrete mass lesion was not visible by CT.  Small to moderate right pleural effusion with right basilar atelectasis was also observed.  Tiny left pulmonary nodules, likely benign, and bilateral renal cysts are noted.  Upper EUS from October 6th revealed a partially obstructing, malignant esophageal tumor was found at the gastroesophageal junction.  It appeared to originate from within the luminal interface/superficial mucosa (Layer 1) and extend in parts through the muscularis propria. A tissue diagnosis was obtained prior to this exam consistent with adenocarcinoma.  He did have a myocardial infarction on June 19th 2021, and had to have coronary angioplasty with 2 stents.  He has been placed on Brilinta 90 mg daily and continues with cardiac rehabilitation.  He also continues aspirin 81 mg daily.  PET imaging from September 9th revealed moderate FDG uptake in the distal esophagus/GE junction mass consistent with known neoplasm.  No locoregional lymphadenopathy  involving the mediastinum or upper abdomen or findings of distant metastatic disease were observed.  He was no longer able to swallow food or medications and relies on his feeding tube.  He had severe constipation despite multiple doses of magnesium citrate and was seen in the emergency room as he developed urinary detention and a catheter was placed.  He followed up with Dr. [name].  He is now using Miralax daily.    INTERVAL HISTORY:  He is here for follow up prior to a 5th dose of weekly paclitaxel/carboplatin.  He notes worsening heart burn and is on pantoprazole 40 mg BID but has also been taking Pepcid twice daily.  This is likely due to esophagitis from radiation, so he was placed on Carafate QID prn.  He is scheduled to complete radiation on November 20th. CBC reveals hemoglobin 10.0 stable from last week at 10.1.  Chemistries are unremarkable.  He is not taking any foods orally. He is using approximately 5 cans of Jevity daily and his weight is stable since his last visit.  He continues tube feedings 5 times daily.  He does have some increasing redness around his feeding tube, but relates this to some leaking he noted this morning. He denies tenderness, fever or chills. He denies nausea or vomiting. He has decreased bowel movements and is using Miralax. He denies shortness of breath, cough or chest pain.  REVIEW OF SYSTEMS:  Review of Systems  Constitutional: Negative for appetite change, chills, diaphoresis, fatigue, fever and unexpected weight change.  HENT:   Negative for hearing loss, lump/mass, mouth sores, nosebleeds (mild), sore throat, tinnitus, trouble swallowing and voice change.   Eyes: Negative  for eye problems and icterus.  Respiratory: Negative for chest tightness, cough, hemoptysis, shortness of breath and wheezing.   Cardiovascular: Negative for chest pain, leg swelling and palpitations.  Gastrointestinal: Negative for abdominal distention, abdominal pain, blood in stool,  constipation, diarrhea, nausea, rectal pain and vomiting.       Worsening heart burn  Endocrine: Negative for hot flashes.  Genitourinary: Negative for bladder incontinence, difficulty urinating, dyspareunia, dysuria, frequency, hematuria and nocturia.   Musculoskeletal: Negative for arthralgias, back pain, flank pain, gait problem, myalgias, neck pain and neck stiffness.  Skin: Negative for itching, rash and wound.  Neurological: Negative for dizziness, extremity weakness, gait problem, headaches, light-headedness, numbness, seizures and speech difficulty.  Hematological: Negative for adenopathy. Does not bruise/bleed easily.  Psychiatric/Behavioral: Negative for confusion, decreased concentration, depression, sleep disturbance and suicidal ideas. The patient is not nervous/anxious.   All other systems reviewed and are negative.    VITALS:  There were no vitals taken for this visit.  Wt Readings from Last 3 Encounters:  02/14/20 168 lb 4.8 oz (76.3 kg)  02/09/20 171 lb (77.6 kg)  02/07/20 169 lb 12.8 oz (77 kg)    There is no height or weight on file to calculate BMI.  Performance status (ECOG): 1 - Symptomatic but completely ambulatory  PHYSICAL EXAM:  Physical Exam Constitutional:      General: He is not in acute distress.    Appearance: Normal appearance. He is normal weight. He is not ill-appearing, toxic-appearing or diaphoretic.  HENT:     Head: Normocephalic and atraumatic.     Right Ear: Tympanic membrane normal.     Left Ear: Tympanic membrane normal.     Nose: Nose normal. No congestion or rhinorrhea.     Mouth/Throat:     Mouth: Mucous membranes are moist.     Pharynx: Oropharynx is clear. No oropharyngeal exudate or posterior oropharyngeal erythema.  Eyes:     General: No scleral icterus.       Right eye: No discharge.        Left eye: No discharge.     Extraocular Movements: Extraocular movements intact.     Conjunctiva/sclera: Conjunctivae normal.     Pupils:  Pupils are equal, round, and reactive to light.  Neck:     Vascular: No carotid bruit.  Cardiovascular:     Rate and Rhythm: Normal rate and regular rhythm.     Pulses: Normal pulses.     Heart sounds: Normal heart sounds. No murmur heard.  No friction rub. No gallop.   Pulmonary:     Effort: Pulmonary effort is normal. No respiratory distress.     Breath sounds: Normal breath sounds. No stridor. No wheezing, rhonchi or rales.  Chest:     Chest wall: No tenderness.  Abdominal:     General: Abdomen is flat. Bowel sounds are normal. There is no distension.     Palpations: Abdomen is soft. There is no mass.     Tenderness: There is no abdominal tenderness. There is no right CVA tenderness, left CVA tenderness, guarding or rebound.     Hernia: No hernia is present.     Comments: The feeding tube is functioning with some redness.  Musculoskeletal:        General: No swelling, tenderness, deformity or signs of injury. Normal range of motion.     Cervical back: Normal range of motion and neck supple. No rigidity or tenderness.     Right lower leg: No edema.  Left lower leg: No edema.  Lymphadenopathy:     Cervical: No cervical adenopathy.  Skin:    General: Skin is warm and dry.     Capillary Refill: Capillary refill takes less than 2 seconds.     Coloration: Skin is not jaundiced or pale.     Findings: No bruising, erythema, lesion or rash.  Neurological:     General: No focal deficit present.     Mental Status: He is alert and oriented to person, place, and time. Mental status is at baseline.     Cranial Nerves: No cranial nerve deficit.     Sensory: No sensory deficit.     Motor: No weakness.     Coordination: Coordination normal.     Gait: Gait normal.     Deep Tendon Reflexes: Reflexes normal.  Psychiatric:        Mood and Affect: Mood normal.        Behavior: Behavior normal.        Thought Content: Thought content normal.        Judgment: Judgment normal.     LABS:     His white count has decreased from 3.9 to 3.6 with an ANC of 3310, his hemoglobin has decreased from 11.5 to 10.1, and his platelet count is normal.  Chemistries are unremarkable.  CBC Latest Ref Rng & Units 02/14/2020 02/07/2020 01/31/2020  WBC - 2.6 3.6 3.9(L)  Hemoglobin 13.5 - 17.5 10.0(A) 10.1(A) 9.9(L)  Hematocrit 41 - 53 31(A) 31(A) 30.1(L)  Platelets 150 - 399 159 212 164   CMP Latest Ref Rng & Units 02/14/2020 02/07/2020 01/31/2020  Glucose 70 - 99 mg/dL - - 199(H)  BUN 4 - _0 Creatinine 0.6 - 1.3 0.8 0.8 0.73  Sodium 137 - 147 135(A) 136(A) 137  Potassium 3.4 - 5.3 4.1 4.1 3.6  Chloride 99 - 108 99 97(A) 101  CO2 13 - 22 29(A) 28(A) 26  Calcium 8.7 - 10.7 9.0 8.8 8.7(L)  Total Protein 6.5 - 8.1 g/dL - - 6.4(L)  Total Bilirubin 0.3 - 1.2 mg/dL - - 0.5  Alkaline Phos 25 - 125 64 64 56  AST 14 - 40 40 35 16  ALT 10 - 40 _1 STUDIES:  No results found.   Allergies: No Known Allergies  Current Medications: Current Outpatient Medications  Medication Sig Dispense Refill  . Acetaminophen Childrens (TYLENOL CHILDRENS CHEWABLES) 160 MG CHEW Chew by mouth.     Marland Kitchen acetaminophen-codeine 120-12 MG/5ML solution     . amiodarone (PACERONE) 200 MG tablet Take 200 mg by mouth daily.     Marland Kitchen amLODipine (NORVASC) 10 MG tablet Take 10 mg by mouth at bedtime.     . Ascorbic Acid (VITAMIN C) 1000 MG tablet Take 1,000 mg by mouth daily. (Patient not taking: Reported on 01/31/2020)    . aspirin 81 MG chewable tablet Chew 81 mg by mouth daily. Chewable tablet daily instead of the regular tablets    . atorvastatin (LIPITOR) 80 MG tablet Take 80 mg by mouth at bedtime.  (Patient not taking: Reported on 01/31/2020)    . benazepril (LOTENSIN) 40 MG tablet Take 40 mg by mouth at bedtime.     . bethanechol (URECHOLINE) 25 MG tablet     . Blood Glucose Monitoring Suppl (TRUE METRIX AIR GLUCOSE METER) w/Device KIT Use as directed.    . Cholecalciferol (VITAMIN D3 PO) Take 1 tablet by  mouth at  bedtime. (Patient not taking: Reported on 01/31/2020)    . ferrous sulfate 325 (65 FE) MG tablet Take 325 mg by mouth at bedtime.    . furosemide (LASIX) 20 MG tablet Take 20 mg by mouth daily as needed for fluid.  (Patient not taking: Reported on 01/31/2020)    . glimepiride (AMARYL) 2 MG tablet Take 2 mg by mouth daily with breakfast.    . magnesium citrate SOLN Take 296 mLs by mouth once.    . metFORMIN (GLUCOPHAGE) 500 MG tablet Take 500 mg by mouth 2 (two) times daily with a meal.    . metoCLOPramide (REGLAN) 10 MG tablet Take 10 mg by mouth 4 (four) times daily.    . metoprolol succinate (TOPROL-XL) 50 MG 24 hr tablet Take 50 mg by mouth daily.     . Multiple Vitamins-Minerals (B COMPLEX-C-E-ZINC) tablet Take 1 tablet by mouth daily. (Patient not taking: Reported on 01/31/2020)    . nitroGLYCERIN (NITROSTAT) 0.4 MG SL tablet Place 0.4 mg under the tongue as needed.    . Omega-3 Fatty Acids (FISH OIL) 1000 MG CAPS Take by mouth. (Patient not taking: Reported on 01/31/2020)    . ondansetron (ZOFRAN) 4 MG tablet Take 4 mg by mouth every 8 (eight) hours as needed for nausea or vomiting.    . ondansetron (ZOFRAN-ODT) 4 MG disintegrating tablet     . oxyCODONE (OXY IR/ROXICODONE) 5 MG immediate release tablet     . pantoprazole (PROTONIX) 40 MG tablet Take 40 mg by mouth 2 (two) times daily.     . polyethylene glycol (MIRALAX / GLYCOLAX) 17 g packet Take 17 g by mouth daily as needed.     . prochlorperazine (COMPAZINE) 10 MG tablet Take 10 mg by mouth every 6 (six) hours as needed for nausea or vomiting.    . sucralfate (CARAFATE) 1 g tablet Take 1 tablet (1 g total) by mouth 4 (four) times daily. 100 tablet 2  . tamsulosin (FLOMAX) 0.4 MG CAPS capsule Take 1 capsule (0.4 mg total) by mouth daily. (Patient taking differently: Take 0.4 mg by mouth at bedtime. ) 30 capsule 1  . ticagrelor (BRILINTA) 90 MG TABS tablet Take 90 mg by mouth 2 (two) times daily.     . TRUE METRIX BLOOD GLUCOSE TEST  test strip     . zinc gluconate 50 MG tablet Take 50 mg by mouth daily.     No current facility-administered medications for this visit.     ASSESSMENT & PLAN:   Assessment:   1.  Stage III (T3N0M0) adenocarcinoma of the GE junction.  This mass measures 1.5 cm.  CT imaging was negative for obvious metastatic disease, and a PET was helpful for accurate staging, especially in view of the pleural effusion.  Testing for HER2 was negative.    2.  History of myocardial infarction back in June 2021.  He required coronary angioplasty with 2 stents.  He has been placed on Brilinta 90 mg daily, which he has been recommended to continue for at least 1 year.  He also continues with cardiac rehabilitation.   3.  History of blood clots of the left leg.  He continues aspirin 81 mg daily.  4.  Mild right pleural effusion.  Thoracentesis yielded 1 L of fluid consistent with reactive mesothelial cells.   5. Anemia.  Evaluation was consistent with iron deficiency and he is on oral supplement.   6. Feeding tube placed due to dysphagia.  He continues regular feedings  and fluid intake.  Oral medications are also taken through the feeding tube.  I have recommended that he increase his water intake through the tube as well.  7.  Urinary retention and hesitancy.  His catheter was removed by Dr. [name] and he has been placed on bethanechol twice daily.  8.  Severe constipation, resolved after a series of enemas.  He continues Miralax daily now.  We reviewed a bowel regimen if this is to recur.    9.   Worsening heart burn likely due to esophagitis from radiation.  I will add in Carafate slurry 1g QID in addition to his other antacids.   Plan: He will proceed with his 5th cycle of weekly paclitaxel/carboplatin on Thursday as scheduled.  We will plan for 6th cycles unless he experiences significant toxicities.  He will continue daily radiation and is scheduled to complete this on November 20th.  He will return on  Monday November 22nd for CBC and CMP prior to a 6th and final dose of chemotherapy on November 24th. He will return to clinic 2 weeks after completion of chemotherapy for repeat CBC, CMP and evaluation.    I provided 30 minutes of face-to-face time during this this encounter and > 50% was spent counseling as documented under my assessment and plan.    Melodye Ped, NP [facility] [place]

## 2020-02-14 ENCOUNTER — Other Ambulatory Visit: Payer: Self-pay | Admitting: Hematology and Oncology

## 2020-02-14 ENCOUNTER — Inpatient Hospital Stay (INDEPENDENT_AMBULATORY_CARE_PROVIDER_SITE_OTHER): Payer: Medicare HMO | Admitting: Hematology and Oncology

## 2020-02-14 ENCOUNTER — Other Ambulatory Visit: Payer: Self-pay

## 2020-02-14 ENCOUNTER — Inpatient Hospital Stay: Payer: Medicare HMO

## 2020-02-14 ENCOUNTER — Telehealth: Payer: Self-pay | Admitting: Oncology

## 2020-02-14 DIAGNOSIS — C155 Malignant neoplasm of lower third of esophagus: Secondary | ICD-10-CM | POA: Diagnosis not present

## 2020-02-14 DIAGNOSIS — Z510 Encounter for antineoplastic radiation therapy: Secondary | ICD-10-CM | POA: Diagnosis not present

## 2020-02-14 DIAGNOSIS — D649 Anemia, unspecified: Secondary | ICD-10-CM | POA: Diagnosis not present

## 2020-02-14 DIAGNOSIS — Z0001 Encounter for general adult medical examination with abnormal findings: Secondary | ICD-10-CM | POA: Diagnosis not present

## 2020-02-14 LAB — BASIC METABOLIC PANEL
BUN: 21 (ref 4–21)
CO2: 29 — AB (ref 13–22)
Chloride: 99 (ref 99–108)
Creatinine: 0.8 (ref 0.6–1.3)
Glucose: 175
Potassium: 4.1 (ref 3.4–5.3)
Sodium: 135 — AB (ref 137–147)

## 2020-02-14 LAB — HEPATIC FUNCTION PANEL
ALT: 29 (ref 10–40)
AST: 40 (ref 14–40)
Alkaline Phosphatase: 64 (ref 25–125)
Bilirubin, Total: 0.6

## 2020-02-14 LAB — COMPREHENSIVE METABOLIC PANEL
Albumin: 3.5 (ref 3.5–5.0)
Calcium: 9 (ref 8.7–10.7)

## 2020-02-14 LAB — CBC AND DIFFERENTIAL
HCT: 31 — AB (ref 41–53)
Hemoglobin: 10 — AB (ref 13.5–17.5)
Neutrophils Absolute: 2.37
Platelets: 159 (ref 150–399)
WBC: 2.6

## 2020-02-14 LAB — CBC: RBC: 3.28 — AB (ref 3.87–5.11)

## 2020-02-14 NOTE — Telephone Encounter (Signed)
Per 11/16 los.Next appt given to patient

## 2020-02-15 DIAGNOSIS — Z510 Encounter for antineoplastic radiation therapy: Secondary | ICD-10-CM | POA: Diagnosis not present

## 2020-02-15 DIAGNOSIS — C155 Malignant neoplasm of lower third of esophagus: Secondary | ICD-10-CM | POA: Diagnosis not present

## 2020-02-16 ENCOUNTER — Ambulatory Visit: Payer: Medicare HMO

## 2020-02-16 ENCOUNTER — Inpatient Hospital Stay: Payer: Medicare HMO

## 2020-02-16 ENCOUNTER — Other Ambulatory Visit: Payer: Self-pay

## 2020-02-16 VITALS — BP 130/60 | HR 69 | Temp 98.2°F | Resp 18 | Ht 67.0 in | Wt 168.8 lb

## 2020-02-16 DIAGNOSIS — I252 Old myocardial infarction: Secondary | ICD-10-CM | POA: Diagnosis not present

## 2020-02-16 DIAGNOSIS — Z79899 Other long term (current) drug therapy: Secondary | ICD-10-CM | POA: Diagnosis not present

## 2020-02-16 DIAGNOSIS — Z510 Encounter for antineoplastic radiation therapy: Secondary | ICD-10-CM | POA: Diagnosis not present

## 2020-02-16 DIAGNOSIS — Z7982 Long term (current) use of aspirin: Secondary | ICD-10-CM | POA: Diagnosis not present

## 2020-02-16 DIAGNOSIS — R339 Retention of urine, unspecified: Secondary | ICD-10-CM | POA: Diagnosis not present

## 2020-02-16 DIAGNOSIS — Z923 Personal history of irradiation: Secondary | ICD-10-CM | POA: Diagnosis not present

## 2020-02-16 DIAGNOSIS — Z5111 Encounter for antineoplastic chemotherapy: Secondary | ICD-10-CM | POA: Diagnosis not present

## 2020-02-16 DIAGNOSIS — C155 Malignant neoplasm of lower third of esophagus: Secondary | ICD-10-CM

## 2020-02-16 DIAGNOSIS — C160 Malignant neoplasm of cardia: Secondary | ICD-10-CM | POA: Diagnosis not present

## 2020-02-16 DIAGNOSIS — R12 Heartburn: Secondary | ICD-10-CM | POA: Diagnosis not present

## 2020-02-16 MED ORDER — DIPHENHYDRAMINE HCL 50 MG/ML IJ SOLN
12.5000 mg | Freq: Once | INTRAMUSCULAR | Status: AC
  Administered 2020-02-16: 12.5 mg via INTRAVENOUS

## 2020-02-16 MED ORDER — SODIUM CHLORIDE 0.9% FLUSH
10.0000 mL | INTRAVENOUS | Status: DC | PRN
  Administered 2020-02-16: 10 mL
  Filled 2020-02-16: qty 10

## 2020-02-16 MED ORDER — FAMOTIDINE IN NACL 20-0.9 MG/50ML-% IV SOLN
20.0000 mg | Freq: Once | INTRAVENOUS | Status: AC
  Administered 2020-02-16: 20 mg via INTRAVENOUS

## 2020-02-16 MED ORDER — DIPHENHYDRAMINE HCL 50 MG/ML IJ SOLN
INTRAMUSCULAR | Status: AC
  Filled 2020-02-16: qty 1

## 2020-02-16 MED ORDER — HEPARIN SOD (PORK) LOCK FLUSH 100 UNIT/ML IV SOLN
500.0000 [IU] | Freq: Once | INTRAVENOUS | Status: AC | PRN
  Administered 2020-02-16: 500 [IU]
  Filled 2020-02-16: qty 5

## 2020-02-16 MED ORDER — SODIUM CHLORIDE 0.9 % IV SOLN
Freq: Once | INTRAVENOUS | Status: AC
  Filled 2020-02-16: qty 250

## 2020-02-16 MED ORDER — SODIUM CHLORIDE 0.9 % IV SOLN
50.0000 mg/m2 | Freq: Once | INTRAVENOUS | Status: AC
  Administered 2020-02-16: 96 mg via INTRAVENOUS
  Filled 2020-02-16: qty 16

## 2020-02-16 MED ORDER — SODIUM CHLORIDE 0.9 % IV SOLN
10.0000 mg | Freq: Once | INTRAVENOUS | Status: AC
  Administered 2020-02-16: 10 mg via INTRAVENOUS
  Filled 2020-02-16: qty 1

## 2020-02-16 MED ORDER — FAMOTIDINE IN NACL 20-0.9 MG/50ML-% IV SOLN
INTRAVENOUS | Status: AC
  Filled 2020-02-16: qty 50

## 2020-02-16 MED ORDER — PALONOSETRON HCL INJECTION 0.25 MG/5ML
0.2500 mg | Freq: Once | INTRAVENOUS | Status: AC
  Administered 2020-02-16: 0.25 mg via INTRAVENOUS

## 2020-02-16 MED ORDER — SODIUM CHLORIDE 0.9 % IV SOLN
192.4000 mg | Freq: Once | INTRAVENOUS | Status: AC
  Administered 2020-02-16: 190 mg via INTRAVENOUS
  Filled 2020-02-16: qty 19

## 2020-02-16 MED ORDER — PALONOSETRON HCL INJECTION 0.25 MG/5ML
INTRAVENOUS | Status: AC
  Filled 2020-02-16: qty 5

## 2020-02-16 NOTE — Progress Notes (Signed)
PT STABLE AT TIME OF DISCHARGE 

## 2020-02-16 NOTE — Patient Instructions (Signed)
[facility] Discharge Instructions for Patients Receiving Chemotherapy  Today you received the following chemotherapy agents Paclitaxel, Carboplatin  To help prevent nausea and vomiting after your treatment, we encourage you to take your nausea medication as directed.   If you develop nausea and vomiting that is not controlled by your nausea medication, call the clinic.   BELOW ARE SYMPTOMS THAT SHOULD BE REPORTED IMMEDIATELY:  *FEVER GREATER THAN 100.5 F  *CHILLS WITH OR WITHOUT FEVER  NAUSEA AND VOMITING THAT IS NOT CONTROLLED WITH YOUR NAUSEA MEDICATION  *UNUSUAL SHORTNESS OF BREATH  *UNUSUAL BRUISING OR BLEEDING  TENDERNESS IN MOUTH AND THROAT WITH OR WITHOUT PRESENCE OF ULCERS  *URINARY PROBLEMS  *BOWEL PROBLEMS  UNUSUAL RASH Items with * indicate a potential emergency and should be followed up as soon as possible.  Feel free to call the clinic should you have any questions or concerns at The clinic phone number is [phone].  Please show the CHEMO ALERT CARD at check-in to the Emergency Department and triage nurse.   

## 2020-02-17 ENCOUNTER — Ambulatory Visit: Payer: Medicare HMO

## 2020-02-17 DIAGNOSIS — C155 Malignant neoplasm of lower third of esophagus: Secondary | ICD-10-CM | POA: Diagnosis not present

## 2020-02-17 DIAGNOSIS — Z510 Encounter for antineoplastic radiation therapy: Secondary | ICD-10-CM | POA: Diagnosis not present

## 2020-02-18 DIAGNOSIS — C155 Malignant neoplasm of lower third of esophagus: Secondary | ICD-10-CM | POA: Diagnosis not present

## 2020-02-18 DIAGNOSIS — Z510 Encounter for antineoplastic radiation therapy: Secondary | ICD-10-CM | POA: Diagnosis not present

## 2020-02-20 ENCOUNTER — Inpatient Hospital Stay: Payer: Medicare HMO | Admitting: Hematology and Oncology

## 2020-02-20 DIAGNOSIS — C155 Malignant neoplasm of lower third of esophagus: Secondary | ICD-10-CM

## 2020-02-20 DIAGNOSIS — D649 Anemia, unspecified: Secondary | ICD-10-CM | POA: Diagnosis not present

## 2020-02-20 DIAGNOSIS — Z510 Encounter for antineoplastic radiation therapy: Secondary | ICD-10-CM | POA: Diagnosis not present

## 2020-02-20 DIAGNOSIS — Z0001 Encounter for general adult medical examination with abnormal findings: Secondary | ICD-10-CM | POA: Diagnosis not present

## 2020-02-20 LAB — CBC AND DIFFERENTIAL
HCT: 27 — AB (ref 41–53)
Hemoglobin: 9.3 — AB (ref 13.5–17.5)
Neutrophils Absolute: 1.84
Platelets: 143 — AB (ref 150–399)
WBC: 2

## 2020-02-20 LAB — BASIC METABOLIC PANEL
BUN: 26 — AB (ref 4–21)
CO2: 31 — AB (ref 13–22)
Chloride: 99 (ref 99–108)
Creatinine: 0.8 (ref 0.6–1.3)
Glucose: 107
Potassium: 4.3 (ref 3.4–5.3)
Sodium: 134 — AB (ref 137–147)

## 2020-02-20 LAB — HEPATIC FUNCTION PANEL
ALT: 26 (ref 10–40)
AST: 26 (ref 14–40)
Alkaline Phosphatase: 59 (ref 25–125)
Bilirubin, Total: 0.7

## 2020-02-20 LAB — COMPREHENSIVE METABOLIC PANEL
Albumin: 3.4 — AB (ref 3.5–5.0)
Calcium: 9.1 (ref 8.7–10.7)

## 2020-02-20 LAB — CBC: RBC: 2.92 — AB (ref 3.87–5.11)

## 2020-02-22 ENCOUNTER — Inpatient Hospital Stay: Payer: Medicare HMO

## 2020-02-22 ENCOUNTER — Ambulatory Visit: Payer: Medicare HMO

## 2020-02-22 ENCOUNTER — Other Ambulatory Visit: Payer: Self-pay

## 2020-02-22 VITALS — BP 121/53 | HR 80 | Temp 97.6°F | Resp 18 | Ht 67.0 in | Wt 170.0 lb

## 2020-02-22 DIAGNOSIS — C160 Malignant neoplasm of cardia: Secondary | ICD-10-CM | POA: Diagnosis not present

## 2020-02-22 DIAGNOSIS — Z5111 Encounter for antineoplastic chemotherapy: Secondary | ICD-10-CM | POA: Diagnosis not present

## 2020-02-22 DIAGNOSIS — C155 Malignant neoplasm of lower third of esophagus: Secondary | ICD-10-CM

## 2020-02-22 DIAGNOSIS — Z79899 Other long term (current) drug therapy: Secondary | ICD-10-CM | POA: Diagnosis not present

## 2020-02-22 DIAGNOSIS — R339 Retention of urine, unspecified: Secondary | ICD-10-CM | POA: Diagnosis not present

## 2020-02-22 DIAGNOSIS — Z923 Personal history of irradiation: Secondary | ICD-10-CM | POA: Diagnosis not present

## 2020-02-22 DIAGNOSIS — I252 Old myocardial infarction: Secondary | ICD-10-CM | POA: Diagnosis not present

## 2020-02-22 DIAGNOSIS — R12 Heartburn: Secondary | ICD-10-CM | POA: Diagnosis not present

## 2020-02-22 DIAGNOSIS — Z7982 Long term (current) use of aspirin: Secondary | ICD-10-CM | POA: Diagnosis not present

## 2020-02-22 MED ORDER — SODIUM CHLORIDE 0.9 % IV SOLN
Freq: Once | INTRAVENOUS | Status: AC
  Filled 2020-02-22: qty 250

## 2020-02-22 MED ORDER — PALONOSETRON HCL INJECTION 0.25 MG/5ML
0.2500 mg | Freq: Once | INTRAVENOUS | Status: AC
  Administered 2020-02-22: 0.25 mg via INTRAVENOUS

## 2020-02-22 MED ORDER — SODIUM CHLORIDE 0.9 % IV SOLN
192.4000 mg | Freq: Once | INTRAVENOUS | Status: AC
  Administered 2020-02-22: 190 mg via INTRAVENOUS
  Filled 2020-02-22: qty 19

## 2020-02-22 MED ORDER — CEPHALEXIN 500 MG PO CAPS: 500.0000 mg | ORAL_CAPSULE | Freq: Two times a day (BID) | ORAL | 0 refills | Status: DC

## 2020-02-22 MED ORDER — SODIUM CHLORIDE 0.9 % IV SOLN
50.0000 mg/m2 | Freq: Once | INTRAVENOUS | Status: AC
  Administered 2020-02-22: 96 mg via INTRAVENOUS
  Filled 2020-02-22: qty 16

## 2020-02-22 MED ORDER — SODIUM CHLORIDE 0.9 % IV SOLN
10.0000 mg | Freq: Once | INTRAVENOUS | Status: AC
  Administered 2020-02-22: 10 mg via INTRAVENOUS
  Filled 2020-02-22: qty 10

## 2020-02-22 MED ORDER — FAMOTIDINE IN NACL 20-0.9 MG/50ML-% IV SOLN
INTRAVENOUS | Status: AC
  Filled 2020-02-22: qty 50

## 2020-02-22 MED ORDER — FAMOTIDINE IN NACL 20-0.9 MG/50ML-% IV SOLN
20.0000 mg | Freq: Once | INTRAVENOUS | Status: AC
  Administered 2020-02-22: 20 mg via INTRAVENOUS

## 2020-02-22 MED ORDER — HEPARIN SOD (PORK) LOCK FLUSH 100 UNIT/ML IV SOLN
500.0000 [IU] | Freq: Once | INTRAVENOUS | Status: AC | PRN
  Administered 2020-02-22: 500 [IU]
  Filled 2020-02-22: qty 5

## 2020-02-22 MED ORDER — DIPHENHYDRAMINE HCL 50 MG/ML IJ SOLN
12.5000 mg | Freq: Once | INTRAMUSCULAR | Status: AC
  Administered 2020-02-22: 12.5 mg via INTRAVENOUS

## 2020-02-22 MED ORDER — PALONOSETRON HCL INJECTION 0.25 MG/5ML
INTRAVENOUS | Status: AC
  Filled 2020-02-22: qty 5

## 2020-02-22 MED ORDER — DIPHENHYDRAMINE HCL 50 MG/ML IJ SOLN
INTRAMUSCULAR | Status: AC
  Filled 2020-02-22: qty 1

## 2020-02-22 NOTE — Progress Notes (Signed)
Pt stable at time of discharge. RN reminds pt to pick up antibiotic at pharmacy.  Pt shows with PEG tube LUQ, scant, clear drainage and reddened excoriated skin half-dollar sized area beneath site. Dayton Scrape, NP in to assess site-script sent in for antibiotics.

## 2020-02-22 NOTE — Patient Instructions (Signed)
[facility] Discharge Instructions for Patients Receiving Chemotherapy  Today you received the following chemotherapy agents Carboplatin, Paclitaxel  To help prevent nausea and vomiting after your treatment, we encourage you to take your nausea medication.   If you develop nausea and vomiting that is not controlled by your nausea medication, call the clinic.   BELOW ARE SYMPTOMS THAT SHOULD BE REPORTED IMMEDIATELY:  *FEVER GREATER THAN 100.5 F  *CHILLS WITH OR WITHOUT FEVER  NAUSEA AND VOMITING THAT IS NOT CONTROLLED WITH YOUR NAUSEA MEDICATION  *UNUSUAL SHORTNESS OF BREATH  *UNUSUAL BRUISING OR BLEEDING  TENDERNESS IN MOUTH AND THROAT WITH OR WITHOUT PRESENCE OF ULCERS  *URINARY PROBLEMS  *BOWEL PROBLEMS  UNUSUAL RASH Items with * indicate a potential emergency and should be followed up as soon as possible.  Feel free to call the clinic should you have any questions or concerns at The clinic phone number is [phone].  Please show the CHEMO ALERT CARD at check-in to the Emergency Department and triage nurse.   

## 2020-02-24 DIAGNOSIS — C155 Malignant neoplasm of lower third of esophagus: Secondary | ICD-10-CM | POA: Diagnosis not present

## 2020-02-24 DIAGNOSIS — R1310 Dysphagia, unspecified: Secondary | ICD-10-CM | POA: Diagnosis not present

## 2020-02-27 DIAGNOSIS — N401 Enlarged prostate with lower urinary tract symptoms: Secondary | ICD-10-CM | POA: Diagnosis not present

## 2020-02-27 DIAGNOSIS — R339 Retention of urine, unspecified: Secondary | ICD-10-CM | POA: Diagnosis not present

## 2020-02-29 DIAGNOSIS — Z9181 History of falling: Secondary | ICD-10-CM | POA: Diagnosis not present

## 2020-02-29 DIAGNOSIS — E785 Hyperlipidemia, unspecified: Secondary | ICD-10-CM | POA: Diagnosis not present

## 2020-02-29 DIAGNOSIS — Z1331 Encounter for screening for depression: Secondary | ICD-10-CM | POA: Diagnosis not present

## 2020-02-29 DIAGNOSIS — Z0000 Encounter for general adult medical examination without abnormal findings: Secondary | ICD-10-CM | POA: Diagnosis not present

## 2020-03-05 NOTE — Progress Notes (Signed)
[facility]  [address] 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  Barium swallow study from August 4th was consistent with stricture of the distal esophagus and so endoscopy was pursued on August 18th by Dr. Nehemiah Settle.  This revealed a 1.0-1.5 cm mass of at the EG junction.  Surgical pathology was consistent with moderately differentiated adenocarcinoma.  HER2 testing was negative.  CT chest, abdomen and pelvis from August 26th was negative for evidence of metastatic disease.  Soft tissue fullness was seen in the region of the esophagogastric junction, although discrete mass lesion was not visible by CT.  Small to moderate right pleural effusion with right basilar atelectasis was also observed.  Tiny left pulmonary nodules, likely benign, and bilateral renal cysts are noted.  Upper EUS from October 6th revealed a partially obstructing, malignant esophageal tumor was found at the gastroesophageal junction.  It appeared to originate from within the luminal interface/superficial mucosa (Layer 1) and extend in parts through the muscularis propria. A tissue diagnosis was obtained prior to this exam consistent with adenocarcinoma.  He did have a myocardial infarction on June 19th 2021, and had to have coronary angioplasty with 2 stents.  He has been placed on Brilinta 90 mg daily and continues with cardiac rehabilitation.  He also continues aspirin 81 mg daily.  PET imaging from September 9th revealed moderate FDG uptake in the distal esophagus/GE junction mass consistent with known neoplasm.  No locoregional  lymphadenopathy involving the mediastinum or upper abdomen or findings of distant metastatic disease were observed.  He was no longer able to swallow food or medications and relies on his feeding tube.  He had severe constipation despite multiple doses of magnesium citrate and was seen in the emergency room as he developed urinary detention and a catheter was placed.  He followed up with Dr. [name].  He is now using Miralax daily.    INTERVAL HISTORY:  He is here for follow up after completing 6 cycle of weekly paclitaxel/carboplatin on November 24th.  He completed radiation on November 20th.  He states that the treatment was rough but he has been fairly well.  He reports fatigue and weakness.  He also notes intermittent epistaxis, where some days this will last all day.  He continues to have dysphagia and difficulty even swallowing his saliva, and continues his tube feedings.  He reports tenderness of the feeding tube site, and I advised that he use triple antibiotic ointment on the area.  He has had increased sinus drainage and mucus production recently.  He reports an elevated PSA at his last visit with Dr. [name] which has him concerned.  He also has a pressure sore of his buttock which burns.  I advise that he try and stay off this area and to pick up lotrimin ointment to use on this wound.  His white count has increased from 2.0 to 3.3 with an [place] of 2710, his hemoglobin has decreased from 9.3 to 7.8, and his platelet count is normal.  As he is only mildly symptomatic and he is finished with treatment, we will hold off on transfusion.  Chemistries are unremarkable except for a sodium of 133, stable.  His  appetite is good, and he has gained nearly 4 pounds since his last visit.  He denies fever, chills or other signs of infection.  He denies nausea, vomiting, bowel issues, or abdominal pain.  He denies sore throat, cough, dyspnea, or chest pain.  REVIEW OF SYSTEMS:  Review of Systems  Constitutional:  Positive for fatigue.       Weakness  HENT:   Positive for nosebleeds (intermittent) and trouble swallowing.   Eyes: Negative.   Respiratory: Negative.   Cardiovascular: Negative.   Gastrointestinal: Negative.  Negative for blood in stool.       Tenderness of the feeding tube site.  Endocrine: Negative.        Cold intolerance  Genitourinary: Negative.    Musculoskeletal: Negative.   Skin: Negative.        Pressure sore of the buttock, which burns  Neurological: Negative.   Hematological: Negative.   Psychiatric/Behavioral: Negative.   All other systems reviewed and are negative.    VITALS:  Blood pressure (!) 103/53, pulse 90, temperature 98.3 F (36.8 C), temperature source Oral, resp. rate 18, height _0  (1.702 m), weight 173 lb 9.6 oz (78.7 kg), SpO2 99 %.  Wt Readings from Last 3 Encounters:  03/06/20 173 lb 9.6 oz (78.7 kg)  02/22/20 170 lb (77.1 kg)  02/16/20 168 lb 12 oz (76.5 kg)    Body mass index is 27.19 kg/m.  Performance status (ECOG): 1 - Symptomatic but completely ambulatory  PHYSICAL EXAM:  Physical Exam Constitutional:      General: He is not in acute distress.    Appearance: Normal appearance. He is normal weight.  HENT:     Head: Normocephalic and atraumatic.  Eyes:     General: No scleral icterus.    Extraocular Movements: Extraocular movements intact.     Conjunctiva/sclera: Conjunctivae normal.     Pupils: Pupils are equal, round, and reactive to light.  Cardiovascular:     Rate and Rhythm: Normal rate and regular rhythm.     Pulses: Normal pulses.     Heart sounds: Normal heart sounds. No murmur heard.  No friction rub. No gallop.   Pulmonary:     Effort: Pulmonary effort is normal. No respiratory distress.     Breath sounds: Normal breath sounds.  Abdominal:     General: Bowel sounds are normal. There is no distension.     Palpations: Abdomen is soft. There is no mass.     Tenderness: There is no abdominal tenderness.     Comments:  He has a feeding tube in the left mid abdomen which has mild erythema, but no exudate.  Musculoskeletal:        General: Normal range of motion.     Cervical back: Normal range of motion and neck supple.     Right lower leg: Edema (mild, worse than left) present.     Left lower leg: Edema (mild) present.  Lymphadenopathy:     Cervical: No cervical adenopathy.  Skin:    General: Skin is warm and dry.     Comments: He has an area of erythema of the buttock without skin breakdown, consistent with pressure sore and evidence of yeast  Neurological:     General: No focal deficit present.     Mental Status: He is alert and oriented to person, place, and time. Mental status is at baseline.  Psychiatric:  Mood and Affect: Mood normal.        Behavior: Behavior normal.        Thought Content: Thought content normal.        Judgment: Judgment normal.     LABS:    CBC Latest Ref Rng & Units 02/20/2020 02/14/2020 02/07/2020  WBC - 2.0 2.6 3.6  Hemoglobin 13.5 - 17.5 9.3(A) 10.0(A) 10.1(A)  Hematocrit 41 - 53 27(A) 31(A) 31(A)  Platelets 150 - 399 143(A) 159 212   CMP Latest Ref Rng & Units 02/20/2020 02/14/2020 02/07/2020  Glucose 70 - 99 mg/dL - - -  BUN 4 - 21 26(A) 21 15  Creatinine 0.6 - 1.3 0.8 0.8 0.8  Sodium 137 - 147 134(A) 135(A) 136(A)  Potassium 3.4 - 5.3 4.3 4.1 4.1  Chloride 99 - 108 99 99 97(A)  CO2 13 - 22 31(A) 29(A) 28(A)  Calcium 8.7 - 10.7 9.1 9.0 8.8  Total Protein 6.5 - 8.1 g/dL - - -  Total Bilirubin 0.3 - 1.2 mg/dL - - -  Alkaline Phos 25 - 125 59 64 64  AST 14 - 40 26 40 35  ALT 10 - 40 _0 STUDIES:  No results found.   Allergies: No Known Allergies  Current Medications: Current Outpatient Medications  Medication Sig Dispense Refill  . Nutritional Supplements (FEEDING SUPPLEMENT, JEVITY 1.5 CAL,) LIQD Place 237 mLs into feeding tube continuous. Average of about 5 1/2 cans a day.    . Acetaminophen Childrens (TYLENOL CHILDRENS CHEWABLES)  160 MG CHEW Chew by mouth.     Marland Kitchen acetaminophen-codeine 120-12 MG/5ML solution     . amiodarone (PACERONE) 200 MG tablet Take 200 mg by mouth daily.     Marland Kitchen amLODipine (NORVASC) 10 MG tablet Take 10 mg by mouth at bedtime.     . Ascorbic Acid (VITAMIN C) 1000 MG tablet Take 1,000 mg by mouth daily. (Patient not taking: Reported on 01/31/2020)    . aspirin 81 MG chewable tablet Chew 81 mg by mouth daily. Chewable tablet daily instead of the regular tablets    . atorvastatin (LIPITOR) 80 MG tablet Take 80 mg by mouth at bedtime.  (Patient not taking: Reported on 01/31/2020)    . benazepril (LOTENSIN) 40 MG tablet Take 40 mg by mouth at bedtime.     . bethanechol (URECHOLINE) 25 MG tablet     . Blood Glucose Monitoring Suppl (TRUE METRIX AIR GLUCOSE METER) w/Device KIT Use as directed.    . cephALEXin (KEFLEX) 500 MG capsule Take 1 capsule (500 mg total) by mouth 2 (two) times daily. 14 capsule 0  . Cholecalciferol (VITAMIN D3 PO) Take 1 tablet by mouth at bedtime. (Patient not taking: Reported on 01/31/2020)    . ferrous sulfate 325 (65 FE) MG tablet Take 325 mg by mouth at bedtime.    . furosemide (LASIX) 20 MG tablet Take 20 mg by mouth daily as needed for fluid.  (Patient not taking: Reported on 01/31/2020)    . glimepiride (AMARYL) 2 MG tablet Take 2 mg by mouth daily with breakfast.    . magnesium citrate SOLN Take 296 mLs by mouth once.    . metFORMIN (GLUCOPHAGE) 500 MG tablet Take 500 mg by mouth 2 (two) times daily with a meal.    . metoCLOPramide (REGLAN) 10 MG tablet Take 10 mg by mouth 4 (four) times daily.    . metoprolol succinate (TOPROL-XL) 50 MG 24 hr tablet Take 50 mg  by mouth daily.     . Multiple Vitamins-Minerals (B COMPLEX-C-E-ZINC) tablet Take 1 tablet by mouth daily. (Patient not taking: Reported on 01/31/2020)    . nitroGLYCERIN (NITROSTAT) 0.4 MG SL tablet Place 0.4 mg under the tongue as needed.    . Omega-3 Fatty Acids (FISH OIL) 1000 MG CAPS Take by mouth. (Patient not taking:  Reported on 01/31/2020)    . ondansetron (ZOFRAN) 4 MG tablet Take 4 mg by mouth every 8 (eight) hours as needed for nausea or vomiting.    . ondansetron (ZOFRAN-ODT) 4 MG disintegrating tablet     . oxyCODONE (OXY IR/ROXICODONE) 5 MG immediate release tablet     . pantoprazole (PROTONIX) 40 MG tablet Take 40 mg by mouth daily.     . polyethylene glycol (MIRALAX / GLYCOLAX) 17 g packet Take 17 g by mouth daily as needed.     . prochlorperazine (COMPAZINE) 10 MG tablet Take 10 mg by mouth every 6 (six) hours as needed for nausea or vomiting.    . sucralfate (CARAFATE) 1 g tablet Take 1 tablet (1 g total) by mouth 4 (four) times daily. 100 tablet 2  . tamsulosin (FLOMAX) 0.4 MG CAPS capsule Take 1 capsule (0.4 mg total) by mouth daily. (Patient taking differently: Take 0.4 mg by mouth at bedtime. ) 30 capsule 1  . ticagrelor (BRILINTA) 90 MG TABS tablet Take 90 mg by mouth 2 (two) times daily.     . TRUE METRIX BLOOD GLUCOSE TEST test strip     . zinc gluconate 50 MG tablet Take 50 mg by mouth daily.     No current facility-administered medications for this visit.     ASSESSMENT & PLAN:   Assessment:   1.  Stage III (T3N0M0) adenocarcinoma of the GE junction.  This mass measures 1.5 cm.  CT imaging was negative for obvious metastatic disease, and a PET was helpful for accurate staging, especially in view of the pleural effusion.  Testing for HER2 was negative.    2.  History of myocardial infarction back in June 2021.  He required coronary angioplasty with 2 stents.  He has been placed on Brilinta 90 mg daily, which he has been recommended to continue for at least 1 year.   3.  History of blood clots of the left leg.  He continues aspirin 81 mg daily.  4.  Mild right pleural effusion.  Thoracentesis yielded 1 L of fluid consistent with reactive mesothelial cells.   5. Anemia, worsening.  Evaluation was consistent with iron deficiency and he is on oral supplement.  However, some of the  worsening is due to radiation and chemotherapy and I do expect it to improve.   6. Feeding tube placed due to dysphagia.  He continues regular feedings and fluid intake.  Oral medications are also taken through the feeding tube.   7.  Urinary retention and hesitancy.  His catheter was removed by Dr. [name] and he has been placed on bethanechol twice daily.  8.  Severe constipation, resolved after a series of enemas.  He continues Miralax daily now.      9.   Worsening heart burn likely due to esophagitis from radiation.   10.  Reported elevated PSA of 5.1 at his last visit with Dr. [name].  He states that he has had an enlarged prostate, but this has him concerned, so we will include a CT pelvis with his next imaging.  He will have a repeat in 3 months.  11.  Pressure sore of the buttock, which does burn.  I advised that he stay off the area, and to pick up some lotrimin ointment for the wound.   Plan: He has completed 6 cycle of weekly carboplatin/paclitaxel as of November 24th and radiation on November 20th.  As he has completed therapy, he will need to be referred to thoracic surgery to determine if he is a candidate for resection.  However, we will wait until January, after we have obtained new CT imaging.  He has worsening anemia, but as he is mildly symptomatic and has completed treatment, we will hold off on transfusion for now.  He knows to call if his symptoms worsen.  He will return on December 16th for CBC, CMP, type and hold and evaluation with Melissa to recheck his blood counts.  I will plan to see him back in 1 month with CBC, CMP and CT chest, abdomen and pelvis for repeat examination.  He understands and agrees to this plan of care.  He knows to call with any concerns.   I provided 35 minutes of face-to-face time during this this encounter and > 50% was spent counseling as documented under my assessment and plan.    Derwood Kaplan, MD [facility] [place] [place]   I, Rita Ohara, am acting as scribe for Derwood Kaplan, MD  I have reviewed this report as typed by the medical scribe, and it is complete and accurate.

## 2020-03-06 ENCOUNTER — Other Ambulatory Visit: Payer: Self-pay

## 2020-03-06 ENCOUNTER — Inpatient Hospital Stay: Payer: Medicare HMO

## 2020-03-06 ENCOUNTER — Encounter: Payer: Self-pay | Admitting: Oncology

## 2020-03-06 ENCOUNTER — Inpatient Hospital Stay: Payer: Medicare HMO | Attending: Oncology | Admitting: Oncology

## 2020-03-06 ENCOUNTER — Telehealth: Payer: Self-pay | Admitting: Oncology

## 2020-03-06 ENCOUNTER — Other Ambulatory Visit: Payer: Self-pay | Admitting: Oncology

## 2020-03-06 ENCOUNTER — Other Ambulatory Visit: Payer: Self-pay | Admitting: Hematology and Oncology

## 2020-03-06 VITALS — BP 103/53 | HR 90 | Temp 98.3°F | Resp 18 | Ht 67.0 in | Wt 173.6 lb

## 2020-03-06 DIAGNOSIS — C155 Malignant neoplasm of lower third of esophagus: Secondary | ICD-10-CM

## 2020-03-06 DIAGNOSIS — D649 Anemia, unspecified: Secondary | ICD-10-CM | POA: Diagnosis not present

## 2020-03-06 DIAGNOSIS — Z0001 Encounter for general adult medical examination with abnormal findings: Secondary | ICD-10-CM | POA: Diagnosis not present

## 2020-03-06 LAB — CBC AND DIFFERENTIAL
HCT: 23 — AB (ref 41–53)
Hemoglobin: 7.8 — AB (ref 13.5–17.5)
Neutrophils Absolute: 2.71
Platelets: 135 — AB (ref 150–399)
WBC: 3.3

## 2020-03-06 LAB — BASIC METABOLIC PANEL
BUN: 16 (ref 4–21)
CO2: 28 — AB (ref 13–22)
Chloride: 99 (ref 99–108)
Creatinine: 0.7 (ref 0.6–1.3)
Glucose: 172
Potassium: 3.7 (ref 3.4–5.3)
Sodium: 133 — AB (ref 137–147)

## 2020-03-06 LAB — CORRECTED CALCIUM (CC13): Calcium, Corrected: 9.4

## 2020-03-06 LAB — HEPATIC FUNCTION PANEL
ALT: 27 (ref 10–40)
AST: 32 (ref 14–40)
Alkaline Phosphatase: 72 (ref 25–125)
Bilirubin, Total: 0.5

## 2020-03-06 LAB — COMPREHENSIVE METABOLIC PANEL
Albumin: 3.3 — AB (ref 3.5–5.0)
Calcium: 8.7 (ref 8.7–10.7)

## 2020-03-06 LAB — CBC
MCV: 96 (ref 76–111)
RBC: 2.41 — AB (ref 3.87–5.11)

## 2020-03-06 NOTE — Telephone Encounter (Signed)
Per 12/7 LOS, patient scheduled for 12/16 Labs, Follow Up with Lenna Sciara - Gave Patient Appt Summary.  CT Abdomen/Pelvis, Chest will be Scheduled for 04/03/2020 - Waiting Pre-Authorization before scheduling

## 2020-03-12 NOTE — Progress Notes (Signed)
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  Barium swallow study from August 4th was consistent with stricture of the distal esophagus and so endoscopy was pursued on August 18th by Dr. Nehemiah Settle.  This revealed a 1.0-1.5 cm mass of at the EG junction.  Surgical pathology was consistent with moderately differentiated adenocarcinoma.  HER2 testing was negative.  CT chest, abdomen and pelvis from August 26th was negative for evidence of metastatic disease.  Soft tissue fullness was seen in the region of the esophagogastric junction, although discrete mass lesion was not visible by CT.  Small to moderate right pleural effusion with right basilar atelectasis was also observed.  Tiny left pulmonary nodules, likely benign, and bilateral renal cysts are noted.  Upper EUS from October 6th revealed a partially obstructing, malignant esophageal tumor was found at the gastroesophageal junction.  It appeared to originate from within the luminal interface/superficial mucosa (Layer 1) and extend in parts through the muscularis propria. A tissue diagnosis was obtained prior to this exam consistent with adenocarcinoma.  He did have a myocardial infarction on June 19th 2021, and had to have coronary angioplasty with 2 stents.  He has been placed on Brilinta 90 mg daily and continues with cardiac rehabilitation.  He also continues aspirin 81 mg daily.  PET imaging from September 9th revealed moderate FDG uptake in the distal esophagus/GE junction mass consistent with known neoplasm.  No locoregional  lymphadenopathy involving the mediastinum or upper abdomen or findings of distant metastatic disease were observed.  He was no longer able to swallow food or medications and relies on his feeding tube.  He had severe constipation despite multiple doses of magnesium citrate and was seen in the emergency room as he developed urinary detention and a catheter was placed.  He followed up with Dr. [name].  He is now using Miralax daily.    INTERVAL HISTORY:  He is here for follow up after completing 6 cycle of weekly paclitaxel/carboplatin on November 24th.  He completed radiation on November 20th.  He states that the treatment was rough but he has been fairly well. He continues to have dysphagia and difficulty even swallowing his saliva, and continues his tube feedings.  His  appetite is good, and he has lost 3 pounds since his last visit.  He denies fever, chills or other signs of infection.  He denies nausea, vomiting, bowel issues, or abdominal pain.  He denies sore throat, cough, dyspnea, or chest pain.  REVIEW OF SYSTEMS:  Review of Systems  Constitutional: Negative for appetite change, chills, diaphoresis, fatigue, fever and unexpected weight change.  HENT:   Negative for hearing loss, lump/mass, mouth sores, nosebleeds, sore throat, tinnitus, trouble swallowing and voice change.   Eyes: Negative for eye problems and icterus.  Respiratory: Negative for chest tightness, cough, hemoptysis, shortness of breath and wheezing.   Cardiovascular: Negative for chest pain, leg swelling and palpitations.  Gastrointestinal: Negative for abdominal distention, abdominal pain, blood in stool, constipation, diarrhea, nausea, rectal pain and vomiting.  Endocrine: Negative for hot flashes.  Genitourinary: Negative for bladder incontinence, difficulty urinating,  dyspareunia, dysuria, frequency, hematuria and nocturia.   Musculoskeletal: Negative for arthralgias, back pain, flank pain, gait problem, myalgias, neck pain  and neck stiffness.  Skin: Negative for itching, rash and wound.  Neurological: Negative for dizziness, extremity weakness, gait problem, headaches, light-headedness, numbness, seizures and speech difficulty.  Hematological: Negative for adenopathy. Does not bruise/bleed easily.  Psychiatric/Behavioral: Negative for confusion, decreased concentration, depression, sleep disturbance and suicidal ideas. The patient is not nervous/anxious.      VITALS:  There were no vitals taken for this visit.  Wt Readings from Last 3 Encounters:  03/15/20 170 lb (77.1 kg)  03/06/20 173 lb 9.6 oz (78.7 kg)  02/22/20 170 lb (77.1 kg)    There is no height or weight on file to calculate BMI.  Performance status (ECOG): 1 - Symptomatic but completely ambulatory  PHYSICAL EXAM:  Physical Exam Constitutional:      General: He is not in acute distress.    Appearance: Normal appearance. He is normal weight. He is not ill-appearing, toxic-appearing or diaphoretic.  HENT:     Head: Normocephalic and atraumatic.     Right Ear: Tympanic membrane normal.     Left Ear: Tympanic membrane normal.     Nose: Nose normal. No congestion or rhinorrhea.     Mouth/Throat:     Mouth: Mucous membranes are moist.     Pharynx: Oropharynx is clear. No oropharyngeal exudate or posterior oropharyngeal erythema.  Eyes:     General: No scleral icterus.       Right eye: No discharge.        Left eye: No discharge.     Extraocular Movements: Extraocular movements intact.     Conjunctiva/sclera: Conjunctivae normal.     Pupils: Pupils are equal, round, and reactive to light.  Neck:     Vascular: No carotid bruit.  Cardiovascular:     Rate and Rhythm: Normal rate and regular rhythm.     Heart sounds: No murmur heard. No friction rub. No gallop.   Pulmonary:     Effort: Pulmonary effort is normal. No respiratory distress.     Breath sounds: Normal breath sounds. No stridor. No wheezing, rhonchi or rales.  Chest:     Chest  wall: No tenderness.  Abdominal:     General: Abdomen is flat. Bowel sounds are normal. There is no distension.     Palpations: There is no mass.     Tenderness: There is no abdominal tenderness. There is no right CVA tenderness, left CVA tenderness, guarding or rebound.     Hernia: No hernia is present.  Musculoskeletal:        General: No swelling, tenderness, deformity or signs of injury. Normal range of motion.     Cervical back: Normal range of motion and neck supple. No rigidity or tenderness.     Right lower leg: No edema.     Left lower leg: No edema.  Lymphadenopathy:     Cervical: No cervical adenopathy.  Skin:    General: Skin is warm and dry.     Capillary Refill: Capillary refill takes less than 2 seconds.     Coloration: Skin is not jaundiced or pale.     Findings: No bruising, erythema, lesion or rash.  Neurological:     General: No focal deficit present.     Mental Status: He is alert and oriented to person, place, and time. Mental status is at baseline.     Cranial Nerves: No cranial nerve deficit.  Sensory: No sensory deficit.     Motor: No weakness.     Coordination: Coordination normal.     Gait: Gait normal.     Deep Tendon Reflexes: Reflexes normal.  Psychiatric:        Mood and Affect: Mood normal.        Behavior: Behavior normal.        Thought Content: Thought content normal.        Judgment: Judgment normal.     LABS:    CBC Latest Ref Rng & Units 03/15/2020 03/06/2020 02/20/2020  WBC - 4.1 3.3 2.0  Hemoglobin 13.5 - 17.5 7.9(A) 7.8(A) 9.3(A)  Hematocrit 41 - 53 24(A) 23(A) 27(A)  Platelets 150 - 399 212 135(A) 143(A)   CMP Latest Ref Rng & Units 03/15/2020 03/06/2020 02/20/2020  Glucose 70 - 99 mg/dL - - -  BUN 4 - _0 26(A)  Creatinine 0.6 - 1.3 0.8 0.7 0.8  Sodium 137 - 147 134(A) 133(A) 134(A)  Potassium 3.4 - 5.3 4.0 3.7 4.3  Chloride 99 - 108 99 99 99  CO2 13 - 22 28(A) 28(A) 31(A)  Calcium 8.7 - 10.7 8.7 8.7 9.1  Total  Protein 6.5 - 8.1 g/dL - - -  Total Bilirubin 0.3 - 1.2 mg/dL - - -  Alkaline Phos 25 - 125 86 72 59  AST 14 - 40 33 32 26  ALT 10 - 40 _1 STUDIES:  No results found.   Allergies: No Known Allergies  Current Medications: Current Outpatient Medications  Medication Sig Dispense Refill  . magic mouthwash w/lidocaine SOLN Take 5 mLs by mouth 4 (four) times daily as needed for mouth pain (swish and swallow 5m). Equal parts of lidocaine, nystatin, mylanta and benadryl Called into [facility] [address]    . Acetaminophen Childrens (TYLENOL CHILDRENS CHEWABLES) 160 MG CHEW Chew by mouth.     .Marland Kitchenacetaminophen-codeine 120-12 MG/5ML solution     . amiodarone (PACERONE) 200 MG tablet Take 200 mg by mouth daily.     .Marland KitchenamLODipine (NORVASC) 10 MG tablet Take 10 mg by mouth at bedtime.     . Ascorbic Acid (VITAMIN C) 1000 MG tablet Take 1,000 mg by mouth daily. (Patient not taking: Reported on 01/31/2020)    . aspirin 81 MG chewable tablet Chew 81 mg by mouth daily. Chewable tablet daily instead of the regular tablets    . atorvastatin (LIPITOR) 80 MG tablet Take 80 mg by mouth at bedtime.  (Patient not taking: Reported on 01/31/2020)    . benazepril (LOTENSIN) 40 MG tablet Take 40 mg by mouth at bedtime.     . bethanechol (URECHOLINE) 25 MG tablet     . Blood Glucose Monitoring Suppl (TRUE METRIX AIR GLUCOSE METER) w/Device KIT Use as directed.    . cephALEXin (KEFLEX) 500 MG capsule Take 1 capsule (500 mg total) by mouth 2 (two) times daily. 14 capsule 0  . Cholecalciferol (VITAMIN D3 PO) Take 1 tablet by mouth at bedtime. (Patient not taking: Reported on 01/31/2020)    . ferrous sulfate 325 (65 FE) MG tablet Take 325 mg by mouth at bedtime.    . furosemide (LASIX) 20 MG tablet Take 20 mg by mouth daily as needed for fluid.  (Patient not taking: Reported on 01/31/2020)    . magnesium citrate SOLN Take 296 mLs by mouth once.    . metFORMIN (GLUCOPHAGE) 500 MG tablet Take 500 mg by mouth daily.     .Marland Kitchen  metoCLOPramide (REGLAN) 10 MG tablet Take 10 mg by mouth 4 (four) times daily.    . metoprolol succinate (TOPROL-XL) 50 MG 24 hr tablet Take 50 mg by mouth daily.     . Multiple Vitamins-Minerals (B COMPLEX-C-E-ZINC) tablet Take 1 tablet by mouth daily. (Patient not taking: Reported on 01/31/2020)    . nitroGLYCERIN (NITROSTAT) 0.4 MG SL tablet Place 0.4 mg under the tongue as needed.    . Nutritional Supplements (FEEDING SUPPLEMENT, JEVITY 1.5 CAL,) LIQD Place 237 mLs into feeding tube continuous. Average of about 5 1/2 cans a day.    . Omega-3 Fatty Acids (FISH OIL) 1000 MG CAPS Take by mouth. (Patient not taking: Reported on 01/31/2020)    . ondansetron (ZOFRAN-ODT) 4 MG disintegrating tablet     . oxyCODONE (OXY IR/ROXICODONE) 5 MG immediate release tablet     . pantoprazole (PROTONIX) 40 MG tablet Take 40 mg by mouth daily.     . polyethylene glycol (MIRALAX / GLYCOLAX) 17 g packet Take 17 g by mouth daily as needed.     . prochlorperazine (COMPAZINE) 10 MG tablet Take 10 mg by mouth every 6 (six) hours as needed for nausea or vomiting.    . sucralfate (CARAFATE) 1 g tablet Take 1 tablet (1 g total) by mouth 4 (four) times daily. 100 tablet 2  . tamsulosin (FLOMAX) 0.4 MG CAPS capsule Take 1 capsule (0.4 mg total) by mouth daily. (Patient taking differently: Take 0.4 mg by mouth at bedtime. ) 30 capsule 1  . ticagrelor (BRILINTA) 90 MG TABS tablet Take 90 mg by mouth 2 (two) times daily.     . TRUE METRIX BLOOD GLUCOSE TEST test strip     . zinc gluconate 50 MG tablet Take 50 mg by mouth daily.     No current facility-administered medications for this visit.     ASSESSMENT & PLAN:   Assessment:   1.  Stage III (T3N0M0) adenocarcinoma of the GE junction.  This mass measures 1.5 cm.  CT imaging was negative for obvious metastatic disease, and a PET was helpful for accurate staging, especially in view of the pleural effusion.  Testing for HER2 was negative.    2.  History of myocardial  infarction back in June 2021.  He required coronary angioplasty with 2 stents.  He has been placed on Brilinta 90 mg daily, which he has been recommended to continue for at least 1 year.   3.  History of blood clots of the left leg.  He continues aspirin 81 mg daily.  4.  Mild right pleural effusion.  Thoracentesis yielded 1 L of fluid consistent with reactive mesothelial cells.   5. Anemia, stable.  Evaluation was consistent with iron deficiency and he is on oral supplement.  However, some of the worsening is due to radiation and chemotherapy and I do expect it to improve.   6. Feeding tube placed due to dysphagia.  He continues regular feedings and fluid intake.  Oral medications are also taken through the feeding tube.   7.  Urinary retention and hesitancy.  His catheter was removed by Dr. [name] and he has been placed on bethanechol twice daily.  8.  Severe constipation, resolved after a series of enemas.  He continues Miralax daily now.      9.   Worsening heart burn likely due to esophagitis from radiation.   10.  Reported elevated PSA of 5.1 at his last visit with Dr. [name].  He states that he  has had an enlarged prostate, but this has him concerned, so we will include a CT pelvis with his next imaging.  He will have a repeat in 3 months.  11.  Pressure sore of the buttock, which does burn.  I advised that he stay off the area, and to pick up some lotrimin ointment for the wound.   Plan: He has completed 6 cycle of weekly carboplatin/paclitaxel as of November 24th and radiation on November 20th.  As he has completed therapy, he will need to be referred to thoracic surgery to determine if he is a candidate for resection.  However, we will wait until January, after we have obtained new CT imaging.  He has worsening anemia, but as he is mildly symptomatic and has completed treatment, we will hold off on transfusion for now.  He knows to call if his symptoms worsen. We are planning CT imaging  on 1/4 and follow up visit on 1/6 with Dr. Hinton Rao.  I provided 20 minutes of face-to-face time during this this encounter and > 50% was spent counseling as documented under my assessment and plan.    Melodye Ped, NP [facility] [place]

## 2020-03-13 DIAGNOSIS — E1169 Type 2 diabetes mellitus with other specified complication: Secondary | ICD-10-CM | POA: Diagnosis not present

## 2020-03-13 DIAGNOSIS — I2510 Atherosclerotic heart disease of native coronary artery without angina pectoris: Secondary | ICD-10-CM | POA: Diagnosis not present

## 2020-03-13 DIAGNOSIS — I10 Essential (primary) hypertension: Secondary | ICD-10-CM | POA: Diagnosis not present

## 2020-03-13 DIAGNOSIS — Z139 Encounter for screening, unspecified: Secondary | ICD-10-CM | POA: Diagnosis not present

## 2020-03-13 DIAGNOSIS — E785 Hyperlipidemia, unspecified: Secondary | ICD-10-CM | POA: Diagnosis not present

## 2020-03-15 ENCOUNTER — Other Ambulatory Visit: Payer: Self-pay | Admitting: Hematology and Oncology

## 2020-03-15 ENCOUNTER — Inpatient Hospital Stay (INDEPENDENT_AMBULATORY_CARE_PROVIDER_SITE_OTHER): Payer: Medicare HMO | Admitting: Hematology and Oncology

## 2020-03-15 ENCOUNTER — Inpatient Hospital Stay: Payer: Medicare HMO

## 2020-03-15 ENCOUNTER — Other Ambulatory Visit: Payer: Self-pay

## 2020-03-15 DIAGNOSIS — C155 Malignant neoplasm of lower third of esophagus: Secondary | ICD-10-CM | POA: Diagnosis not present

## 2020-03-15 LAB — HEPATIC FUNCTION PANEL
ALT: 30 (ref 10–40)
AST: 33 (ref 14–40)
Alkaline Phosphatase: 86 (ref 25–125)
Bilirubin, Total: 0.5

## 2020-03-15 LAB — BASIC METABOLIC PANEL
BUN: 17 (ref 4–21)
CO2: 28 — AB (ref 13–22)
Chloride: 99 (ref 99–108)
Creatinine: 0.8 (ref 0.6–1.3)
Glucose: 186
Potassium: 4 (ref 3.4–5.3)
Sodium: 134 — AB (ref 137–147)

## 2020-03-15 LAB — CBC
MCV: 99 — AB (ref 80–94)
RBC: 2.4 — AB (ref 3.87–5.11)

## 2020-03-15 LAB — CBC AND DIFFERENTIAL
HCT: 24 — AB (ref 41–53)
Hemoglobin: 7.9 — AB (ref 13.5–17.5)
Neutrophils Absolute: 3.16
Platelets: 212 (ref 150–399)
WBC: 4.1

## 2020-03-15 LAB — COMPREHENSIVE METABOLIC PANEL
Albumin: 3.3 — AB (ref 3.5–5.0)
Calcium: 8.7 (ref 8.7–10.7)

## 2020-03-27 ENCOUNTER — Other Ambulatory Visit: Payer: Self-pay | Admitting: Hematology and Oncology

## 2020-03-29 DIAGNOSIS — R1310 Dysphagia, unspecified: Secondary | ICD-10-CM | POA: Diagnosis not present

## 2020-03-29 DIAGNOSIS — C155 Malignant neoplasm of lower third of esophagus: Secondary | ICD-10-CM | POA: Diagnosis not present

## 2020-04-02 ENCOUNTER — Telehealth: Payer: Self-pay

## 2020-04-02 NOTE — Telephone Encounter (Addendum)
Pt notified of below. Instructed him to call us @ [phone] through the night if his temp persists. Pt verbalized understanding.   ----- Message from Pascal Lux, NP sent at 04/02/2020  4:14 PM EST ----- Regarding: RE: Temp 100.4 Just watch temp. Call if it gets higher. Hopefully, he'll do ok so we can get this CT. ----- Message ----- From: Hipolito Bayley, RN Sent: 04/02/2020   4:12 PM EST To: Pascal Lux, NP Subject: Temp 100.4                                     Pt's wife, Pam, called to report that pt has 100.4. No other real symptoms, other than occasional productive cough-clear phlegm. No UTI s/s. No SOB. No achiness. He can smell & taste. No exposure to anyone with COVID. He is scheduled for CT scan @ 1130 tomorrow. Please advise. [phone]

## 2020-04-03 ENCOUNTER — Telehealth: Payer: Self-pay

## 2020-04-03 ENCOUNTER — Other Ambulatory Visit: Payer: Self-pay | Admitting: Hematology and Oncology

## 2020-04-03 DIAGNOSIS — C155 Malignant neoplasm of lower third of esophagus: Secondary | ICD-10-CM | POA: Diagnosis not present

## 2020-04-03 DIAGNOSIS — I2510 Atherosclerotic heart disease of native coronary artery without angina pectoris: Secondary | ICD-10-CM | POA: Diagnosis not present

## 2020-04-03 DIAGNOSIS — R918 Other nonspecific abnormal finding of lung field: Secondary | ICD-10-CM | POA: Diagnosis not present

## 2020-04-03 DIAGNOSIS — I700 Atherosclerosis of aorta: Secondary | ICD-10-CM | POA: Diagnosis not present

## 2020-04-03 DIAGNOSIS — C160 Malignant neoplasm of cardia: Secondary | ICD-10-CM | POA: Diagnosis not present

## 2020-04-03 MED ORDER — LEVOFLOXACIN 25 MG/ML PO SOLN: 500.0000 mg | Freq: Every day | ORAL | 0 refills | Status: DC

## 2020-04-03 NOTE — Telephone Encounter (Signed)
I spoke with pt, notified him that [facility], had reviewed CT results, and has decided to send in Rx . Pt verbalized understanding.

## 2020-04-04 NOTE — Progress Notes (Signed)
[facility]  [address] [place],    [id number] [phone]  Clinic Day:  04/05/2020  Referring physician: Nicoletta Dress, MD   This document serves as a record of services personally performed by [name], MD. It was created on their behalf by [facility], a trained medical scribe. The creation of this record is based on the scribe's personal observations and the provider's statements to them.   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  Barium swallow study from August 4th was consistent with stricture of the distal esophagus and so endoscopy was pursued on August 18th by Dr. Nehemiah Settle.  This revealed a 1.0-1.5 cm mass of at the EG junction.  Surgical pathology was consistent with moderately differentiated adenocarcinoma.  HER2 testing was negative.  CT chest, abdomen and pelvis from August 26th was negative for evidence of metastatic disease.  Soft tissue fullness was seen in the region of the esophagogastric junction, although discrete mass lesion was not visible by CT.  Small to moderate right pleural effusion with right basilar atelectasis was also observed.  Tiny left pulmonary nodules, likely benign, and bilateral renal cysts are noted.  Upper EUS from October 6th revealed a partially obstructing, malignant esophageal tumor was found at the gastroesophageal junction.  It appeared to originate from within the luminal interface/superficial mucosa (Layer 1) and extend in parts through the muscularis propria. A tissue diagnosis was obtained prior to this exam consistent with adenocarcinoma.  He did have a myocardial infarction on June 19th 2021, and had to have coronary angioplasty with 2 stents.   He has been placed on Brilinta 90 mg daily and continues with cardiac rehabilitation.  He also continues aspirin 81 mg daily.  PET imaging from September 9th revealed moderate FDG uptake in the distal esophagus/GE junction mass consistent with known neoplasm.  No locoregional lymphadenopathy involving the mediastinum or upper abdomen or findings of distant metastatic disease were observed.  He was no longer able to swallow food or medications and relies on his feeding tube.  He had severe constipation despite multiple doses of magnesium citrate and was seen in the emergency room as he developed urinary detention and a catheter was placed.  He followed up with Dr. [name].  He is now using Miralax daily.  He completed radiation on November 20th, and 6 cycles of weekly paclitaxel/carboplatin on November 24th.   INTERVAL HISTORY:  He is here for routine follow up and to discuss recent imaging results.  CT chest, abdomen and pelvis from January 4th revealed stable ill-defined soft tissue thickening about the gastroesophageal junction without discrete mass and no evidence of metastatic disease in the chest, abdomen, or pelvis.  There is new heterogeneous and ground-glass airspace opacity in the left lung base, concerning for infection or aspiration.  The moderate right pleural effusion with associated atelectasis or consolidation remains stable.  He is now on an antibiotic, Levaquin 500 mg for 7 days which he started Tuesday.  If he does not improve, he knows to contact us.  He continues to have dysphagia and odynophagia.  He did have an episode of epistaxis yesterday and today.  He also notes some sleep disturbance the last couple of nights.  He also notes some heart burn.  His hemoglobin has improved from 7.9 to 9.7, and his white count and platelets are normal.  Chemistries are unremarkable except for a sodium of 131.  He continues tube feedings 4 times daily.  His  appetite is good, and he has lost 1 pound since his  last visit.  He denies fever, chills or other signs of infection.  He denies nausea, vomiting, bowel issues, or abdominal pain.  He denies sore throat, cough, dyspnea, or chest pain.  REVIEW OF SYSTEMS:  Review of Systems  Constitutional: Negative.   HENT:   Positive for nosebleeds and trouble swallowing.        Odynophagia  Eyes: Negative.   Respiratory: Negative.   Cardiovascular: Negative.   Gastrointestinal: Negative.   Endocrine: Negative.   Genitourinary: Negative.    Musculoskeletal: Negative.   Skin: Negative.   Neurological: Negative.   Hematological: Negative.   Psychiatric/Behavioral: Positive for sleep disturbance.  All other systems reviewed and are negative.    VITALS:  Blood pressure (!) 119/57, pulse 77, temperature 98.2 F (36.8 C), temperature source Oral, resp. rate 18, height _0  (1.702 m), weight 169 lb (76.7 kg), SpO2 98 %.  Wt Readings from Last 3 Encounters:  04/05/20 169 lb (76.7 kg)  03/15/20 170 lb (77.1 kg)  03/06/20 173 lb 9.6 oz (78.7 kg)    Body mass index is 26.47 kg/m.  Performance status (ECOG): 1 - Symptomatic but completely ambulatory  PHYSICAL EXAM:  Physical Exam Constitutional:      General: He is not in acute distress.    Appearance: Normal appearance. He is normal weight.  HENT:     Head: Normocephalic and atraumatic.  Eyes:     General: No scleral icterus.    Extraocular Movements: Extraocular movements intact.     Conjunctiva/sclera: Conjunctivae normal.     Pupils: Pupils are equal, round, and reactive to light.  Cardiovascular:     Rate and Rhythm: Normal rate and regular rhythm.     Pulses: Normal pulses.     Heart sounds: Normal heart sounds. No murmur heard. No friction rub. No gallop.   Pulmonary:     Effort: Pulmonary effort is normal. No respiratory distress.     Breath sounds: Decreased breath sounds (right base) present.  Abdominal:     General: Bowel sounds are normal. There is no distension.      Palpations: Abdomen is soft. There is no mass.     Tenderness: There is no abdominal tenderness.  Musculoskeletal:        General: Normal range of motion.     Cervical back: Normal range of motion and neck supple.     Right lower leg: No edema.     Left lower leg: No edema.  Lymphadenopathy:     Cervical: No cervical adenopathy.  Skin:    General: Skin is warm and dry.  Neurological:     General: No focal deficit present.     Mental Status: He is alert and oriented to person, place, and time. Mental status is at baseline.  Psychiatric:        Mood and Affect: Mood normal.        Behavior: Behavior normal.        Thought Content: Thought content normal.        Judgment: Judgment normal.    LABS:    CBC Latest Ref Rng & Units 03/15/2020 03/06/2020 02/20/2020  WBC - 4.1 3.3 2.0  Hemoglobin 13.5 - 17.5 7.9(A) 7.8(A)  9.3(A)  Hematocrit 41 - 53 24(A) 23(A) 27(A)  Platelets 150 - 399 212 135(A) 143(A)   CMP Latest Ref Rng & Units 03/15/2020 03/06/2020 02/20/2020  Glucose 70 - 99 mg/dL - - -  BUN 4 - _0 26(A)  Creatinine 0.6 - 1.3 0.8 0.7 0.8  Sodium 137 - 147 134(A) 133(A) 134(A)  Potassium 3.4 - 5.3 4.0 3.7 4.3  Chloride 99 - 108 99 99 99  CO2 13 - 22 28(A) 28(A) 31(A)  Calcium 8.7 - 10.7 8.7 8.7 9.1  Total Protein 6.5 - 8.1 g/dL - - -  Total Bilirubin 0.3 - 1.2 mg/dL - - -  Alkaline Phos 25 - 125 86 72 59  AST 14 - 40 33 32 26  ALT 10 - 40 _1 STUDIES:   CT chest, abdomen and pelvis with contrast on 04/03/2020 showed: 1. Unchanged, ill-defined soft tissue thickening about the gastroesophageal junction without discrete mass. 2. No evidence of metastatic disease in the chest, abdomen, or pelvis. 3. There is new heterogeneous and ground-glass airspace opacity in the left lung base, concerning for infection or aspiration. 4. Unchanged moderate right pleural effusion with associated atelectasis or consolidation. No obvious pleural thickening or nodularity.  Attention on follow-up. 5. Status post interval percutaneous gastrostomy. 6. Coronary artery disease.  Aortic Atherosclerosis (ICD10-I70.0).   Allergies: No Known Allergies  Current Medications: Current Outpatient Medications  Medication Sig Dispense Refill  . Acetaminophen Childrens (TYLENOL CHILDRENS CHEWABLES) 160 MG CHEW Chew by mouth.     Marland Kitchen acetaminophen-codeine 120-12 MG/5ML solution     . amiodarone (PACERONE) 200 MG tablet Take 200 mg by mouth daily.     Marland Kitchen amLODipine (NORVASC) 10 MG tablet Take 10 mg by mouth at bedtime.     . Ascorbic Acid (VITAMIN C) 1000 MG tablet Take 1,000 mg by mouth daily. (Patient not taking: Reported on 01/31/2020)    . aspirin 81 MG chewable tablet Chew 81 mg by mouth daily. Chewable tablet daily instead of the regular tablets    . atorvastatin (LIPITOR) 80 MG tablet Take 80 mg by mouth at bedtime.  (Patient not taking: Reported on 01/31/2020)    . benazepril (LOTENSIN) 40 MG tablet Take 40 mg by mouth at bedtime.     . bethanechol (URECHOLINE) 25 MG tablet     . Blood Glucose Monitoring Suppl (TRUE METRIX AIR GLUCOSE METER) w/Device KIT Use as directed.    . Cholecalciferol (VITAMIN D3 PO) Take 1 tablet by mouth at bedtime. (Patient not taking: Reported on 01/31/2020)    . ferrous sulfate 325 (65 FE) MG tablet Take 325 mg by mouth at bedtime.    . furosemide (LASIX) 20 MG tablet Take 20 mg by mouth daily as needed for fluid.  (Patient not taking: Reported on 01/31/2020)    . levofloxacin (LEVAQUIN) 25 MG/ML solution Take 20 mLs (500 mg total) by mouth daily for 7 days. 140 mL 0  . magic mouthwash w/lidocaine SOLN Take 5 mLs by mouth 4 (four) times daily as needed for mouth pain (swish and swallow 19m). Equal parts of lidocaine, nystatin, mylanta and benadryl Called into [facility] [address]    . magnesium citrate SOLN Take 296 mLs by mouth once.    . metFORMIN (GLUCOPHAGE) 500 MG tablet Take 500 mg by mouth daily.    . metoCLOPramide (REGLAN) 10 MG tablet  Take 10 mg by mouth 4 (four) times daily.    . metoprolol succinate (  TOPROL-XL) 50 MG 24 hr tablet Take 50 mg by mouth daily.     . Multiple Vitamins-Minerals (B COMPLEX-C-E-ZINC) tablet Take 1 tablet by mouth daily. (Patient not taking: Reported on 01/31/2020)    . nitroGLYCERIN (NITROSTAT) 0.4 MG SL tablet Place 0.4 mg under the tongue as needed.    . Nutritional Supplements (FEEDING SUPPLEMENT, JEVITY 1.5 CAL,) LIQD Place 237 mLs into feeding tube continuous. Average of about 5 1/2 cans a day.    . Omega-3 Fatty Acids (FISH OIL) 1000 MG CAPS Take by mouth. (Patient not taking: Reported on 01/31/2020)    . ondansetron (ZOFRAN-ODT) 4 MG disintegrating tablet     . oxyCODONE (OXY IR/ROXICODONE) 5 MG immediate release tablet     . pantoprazole (PROTONIX) 40 MG tablet Take 40 mg by mouth daily.     . polyethylene glycol (MIRALAX / GLYCOLAX) 17 g packet Take 17 g by mouth daily as needed.     . prochlorperazine (COMPAZINE) 10 MG tablet Take 10 mg by mouth every 6 (six) hours as needed for nausea or vomiting.    . sucralfate (CARAFATE) 1 g tablet Take 1 tablet (1 g total) by mouth 4 (four) times daily. 100 tablet 2  . tamsulosin (FLOMAX) 0.4 MG CAPS capsule Take 1 capsule (0.4 mg total) by mouth daily. (Patient taking differently: Take 0.4 mg by mouth at bedtime. ) 30 capsule 1  . ticagrelor (BRILINTA) 90 MG TABS tablet Take 90 mg by mouth 2 (two) times daily.     . TRUE METRIX BLOOD GLUCOSE TEST test strip     . zinc gluconate 50 MG tablet Take 50 mg by mouth daily.     No current facility-administered medications for this visit.     ASSESSMENT & PLAN:   Assessment:   1.  Stage III (T3N0M0) adenocarcinoma of the GE junction.  This mass measures 1.5 cm.  CT imaging was negative for obvious metastatic disease, and a PET was helpful for accurate staging, especially in view of the pleural effusion.  Testing for HER2 was negative.    2.  History of myocardial infarction back in June 2021.  He  required coronary angioplasty with 2 stents.  He has been placed on Brilinta 90 mg daily, which he has been recommended to continue for at least 1 year.   3.  History of blood clots of the left leg.  He continues aspirin 81 mg daily.  4.  Mild right pleural effusion.  Thoracentesis yielded 1 L of fluid consistent with reactive mesothelial cells, but the pleural effusion has persisted.   5. Anemia, much improved.  Evaluation was consistent with iron deficiency and he is on oral supplement.  However, some of the worsening is due to radiation and chemotherapy and I do expect it to improve.   6. Feeding tube placed due to dysphagia and odynophagia.  He continues regular feedings and fluid intake.  Oral medications are also taken through the feeding tube.   7.   Worsening heart burn likely due to esophagitis from radiation.   8.  Reported elevated PSA of 5.1 at his last visit with Dr. [name].  He states that he has had an enlarged prostate.  He will have a repeat in 3 months.  Plan: CT imaging is stable but it is difficult to determine if the thickening is from radiation changes or residual disease. We will therefore schedule him for PET imaging on January 17th.  As he has completed therapy, he will need  to be referred to cardiothoracic surgery to determine if he is a candidate for resection.  There is evidence of developing infection at the left lung base, which is suspicious for early aspiration pneumonia.  He has been placed on an antibiotic with Levaquin 500 mg daily, and knows to contact us if his symptoms worsen.  We will see him back in 2 weeks with CBC, CMP and PET imaging for repeat evaluation.  He understands and agrees with this plan of care.  I have answered their questions and they know to call with any concerns.  I provided 25 minutes of face-to-face time during this this encounter and > 50% was spent counseling as documented under my assessment and plan.    Derwood Kaplan, MD [facility] [place]   I, Rita Ohara, am acting as scribe for Derwood Kaplan, MD  I have reviewed this report as typed by the medical scribe, and it is complete and accurate.

## 2020-04-05 ENCOUNTER — Other Ambulatory Visit: Payer: Self-pay

## 2020-04-05 ENCOUNTER — Inpatient Hospital Stay: Payer: Medicare HMO | Attending: Oncology | Admitting: Oncology

## 2020-04-05 ENCOUNTER — Encounter: Payer: Self-pay | Admitting: Oncology

## 2020-04-05 ENCOUNTER — Other Ambulatory Visit: Payer: Self-pay | Admitting: Oncology

## 2020-04-05 ENCOUNTER — Telehealth: Payer: Self-pay | Admitting: Oncology

## 2020-04-05 VITALS — BP 119/57 | HR 77 | Temp 98.2°F | Resp 18 | Ht 67.0 in | Wt 169.0 lb

## 2020-04-05 DIAGNOSIS — C155 Malignant neoplasm of lower third of esophagus: Secondary | ICD-10-CM | POA: Diagnosis not present

## 2020-04-05 DIAGNOSIS — J90 Pleural effusion, not elsewhere classified: Secondary | ICD-10-CM

## 2020-04-05 DIAGNOSIS — D500 Iron deficiency anemia secondary to blood loss (chronic): Secondary | ICD-10-CM

## 2020-04-05 NOTE — Telephone Encounter (Signed)
Per 1/6 los next appt given to patient 

## 2020-04-09 ENCOUNTER — Other Ambulatory Visit: Payer: Self-pay | Admitting: Oncology

## 2020-04-09 ENCOUNTER — Telehealth: Payer: Self-pay

## 2020-04-09 DIAGNOSIS — J90 Pleural effusion, not elsewhere classified: Secondary | ICD-10-CM

## 2020-04-09 MED ORDER — LEVOFLOXACIN 25 MG/ML PO SOLN: 500.0000 mg | Freq: Every day | ORAL | 0 refills | Status: DC

## 2020-04-09 NOTE — Telephone Encounter (Signed)
-----   Message from Derwood Kaplan, MD sent at 04/05/2020  2:56 PM EST ----- Regarding: referral Please ref cardiothoracic surgery in [place] for esophageal  cancer which has been treated with chemo and radiation.  CT equivocal so will get PET scan

## 2020-04-09 NOTE — Telephone Encounter (Signed)
Pt states he has been on Levofloxacin for fluid in right lung.  Still coughing up a lot of clear phlegm.  Does he need another round>

## 2020-04-09 NOTE — Telephone Encounter (Signed)
Referral sent via fax.

## 2020-04-10 ENCOUNTER — Other Ambulatory Visit: Payer: Self-pay | Admitting: Oncology

## 2020-04-10 DIAGNOSIS — J690 Pneumonitis due to inhalation of food and vomit: Secondary | ICD-10-CM

## 2020-04-10 MED ORDER — LEVOFLOXACIN 500 MG PO TABS: 500.0000 mg | ORAL_TABLET | Freq: Every day | ORAL | 0 refills | Status: AC

## 2020-04-10 NOTE — Progress Notes (Signed)
lev

## 2020-04-13 ENCOUNTER — Encounter: Payer: Self-pay | Admitting: Oncology

## 2020-04-16 ENCOUNTER — Encounter: Payer: Self-pay | Admitting: Oncology

## 2020-04-17 ENCOUNTER — Encounter: Payer: Self-pay | Admitting: Thoracic Surgery (Cardiothoracic Vascular Surgery)

## 2020-04-17 ENCOUNTER — Other Ambulatory Visit: Payer: Self-pay

## 2020-04-17 ENCOUNTER — Institutional Professional Consult (permissible substitution): Payer: Medicare HMO | Admitting: Thoracic Surgery (Cardiothoracic Vascular Surgery)

## 2020-04-17 ENCOUNTER — Telehealth: Payer: Self-pay | Admitting: Oncology

## 2020-04-17 VITALS — BP 119/63 | HR 94 | Resp 20 | Ht 67.0 in | Wt 166.8 lb

## 2020-04-17 DIAGNOSIS — C155 Malignant neoplasm of lower third of esophagus: Secondary | ICD-10-CM | POA: Diagnosis not present

## 2020-04-17 NOTE — Progress Notes (Signed)
[address]       [place] [address]             [phone]                    Rand J Sangiovanni [facility] Medical Record #[id number] Date of Birth: 1946/02/07  Referring: Nicoletta Dress, MD Primary Care: Nicoletta Dress, MD Primary Cardiologist: No primary care provider on file.  Chief Complaint:    Chief Complaint  Patient presents with  . Esophageal Cancer    New patient consultation Chest CT 1/4, PET 9/9, ENDO 10/6    History of Present Illness:    Jeremiah Ray 75 y.o. 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  The patient originally began his evaluation after surviving a V. fib arrest where he required resuscitation for over 25 minutes.  He subsequently underwent PCI with stent placement x2 and has been on Brilinta since the event in June 2021.  While recovering from this event he noted some dysphagia and under further evaluation underwent an EGD where a distal esophageal mass was noted and biopsied.  The mass was partially obstructing and during his neoadjuvant therapy he required placement of an open gastrotomy tube.  The patient states that he has lost approximately 30 pounds since he began having dysphagia.  His weight has been stable between 163 and 169pounds since completing his chemotherapy.  He remains unable to swallow due to odynophagia, and states that living with his feeding tube is not sustainable.  He denies any respiratory symptoms but has had a thoracentesis on the right in the last month 1 L of fluid was removed.  He is also being treated for a  left-sided pneumonia.    Zubrod Score: At the time of surgery this patient's most appropriate activity status/level should be described as: _0     0    Normal activity, no symptoms _1     1    Restricted in physical strenuous activity but ambulatory, able to do out light work _2     2    Ambulatory and capable of self care, unable to  do work activities, up and about               >50 % of waking hours                              _3     3    Only limited self care, in bed greater than 50% of waking hours _4     4    Completely disabled, no self care, confined to bed or chair _5     5    Moribund   Past Medical History:  Diagnosis Date  . Acute systolic heart failure ([facility]) 09/21/2019  . Acute UTI   . Age-related cataract of right eye   . Anemia, unspecified   . Anemia, unspecified   . Anterolisthesis   . Arthritis   . Basal cell carcinoma of forehead   . Benign essential hypertension   . Bilateral primary osteoarthritis of hip   . Cancer ([facility])    basal cell carcinoma removed on head  . Chest pain   . DDD (degenerative disc disease), cervical   . Diabetes mellitus without complication ([facility])    type II  . DM type 2 with diabetic dyslipidemia ([facility])   .  Epididymal cyst   . Exudative pleurisy   . Family history of adverse reaction to anesthesia    mother had confusion after surgery  . Heart murmur   . History of lumbosacral spine surgery   . Hydrocele, unspecified   . Hyperlipidemia   . Hyperlipidemia LDL goal <100   . Hypertension   . Iron deficiency anemia secondary to blood loss (chronic)   . Iron deficiency anemia secondary to blood loss (chronic)   . Lumbar nerve root impingement   . Malignant neoplasm of abdominal esophagus (HCC)   . Malignant neoplasm of abdominal esophagus (HCC)   . Malignant neoplasm of abdominal esophagus (HCC)   . Myocardial infarction ([facility])    09/17/2019  . Sleep apnea    uses CPAP  . Spinal stenosis, lumbar   . Spondylolysis, lumbar region   . Urinary retention   . Vitamin D deficiency     Past Surgical History:  Procedure Laterality Date  . ANTERIOR CRUCIATE LIGAMENT REPAIR Right   . ANTERIOR LAT LUMBAR FUSION Right 01/05/2017   Procedure: Right Lumbar three-four Transpsoas lumbar interbody fusion/Lumbar three-four laminectomy for decompression/Lumbar four-five Gill  procedure/Lumbar three-five Posterior segemental instrumented fusion/Neuromonitoring/[facility];  Surgeon: Ditty, Kevan Ny, MD;  Location: [facility];  Service: Neurosurgery;  Laterality: Right;  . APPENDECTOMY     2015  . APPLICATION OF ROBOTIC ASSISTANCE FOR SPINAL PROCEDURE N/A 01/05/2017   Procedure: APPLICATION OF ROBOTIC ASSISTANCE FOR SPINAL PROCEDURE;  Surgeon: Ditty, Kevan Ny, MD;  Location: [facility];  Service: Neurosurgery;  Laterality: N/A;  . ESOPHAGOGASTRODUODENOSCOPY (EGD) WITH PROPOFOL N/A 01/04/2020   Procedure: ESOPHAGOGASTRODUODENOSCOPY (EGD) WITH PROPOFOL;  Surgeon: Arta Silence, MD;  Location: [facility];  Service: Endoscopy;  Laterality: N/A;  . EUS N/A 01/04/2020   Procedure: UPPER ENDOSCOPIC ULTRASOUND (EUS) RADIAL VS. Linear;  Surgeon: Arta Silence, MD;  Location: [facility];  Service: Endoscopy;  Laterality: N/A;  . HERNIA REPAIR     umbilical with mesh, 0932    Family History  Problem Relation Age of Onset  . Uterine cancer Sister   . Breast cancer Sister      Social History   Tobacco Use  Smoking Status Former Smoker  . Packs/day: 1.00  . Years: 10.00  . Pack years: 10.00  . Types: Cigarettes  . Quit date: 01/04/1976  . Years since quitting: 44.3  Smokeless Tobacco Never Used    Social History   Substance and Sexual Activity  Alcohol Use No     No Known Allergies  Current Outpatient Medications  Medication Sig Dispense Refill  . amiodarone (PACERONE) 200 MG tablet Take 200 mg by mouth daily.     Marland Kitchen amLODipine (NORVASC) 10 MG tablet Take 10 mg by mouth at bedtime.     Marland Kitchen aspirin 81 MG chewable tablet Chew 81 mg by mouth daily. Chewable tablet daily instead of the regular tablets    . benazepril (LOTENSIN) 40 MG tablet Take 40 mg by mouth at bedtime.     . Blood Glucose Monitoring Suppl (TRUE METRIX AIR GLUCOSE METER) w/Device KIT Use as directed.    . ferrous sulfate 325 (65 FE) MG tablet Take 325 mg by mouth at bedtime.    Marland Kitchen levofloxacin  (LEVAQUIN) 500 MG tablet Take 1 tablet (500 mg total) by mouth daily for 10 days. 10 tablet 0  . magic mouthwash w/lidocaine SOLN Take 5 mLs by mouth 4 (four) times daily as needed for mouth pain (swish and swallow 58m). Equal parts of lidocaine, nystatin,  mylanta and benadryl Called into [facility] [place] [address]    . metFORMIN (GLUCOPHAGE) 500 MG tablet Take 500 mg by mouth daily.    . metoprolol succinate (TOPROL-XL) 50 MG 24 hr tablet Take 50 mg by mouth daily.     . nitroGLYCERIN (NITROSTAT) 0.4 MG SL tablet Place 0.4 mg under the tongue as needed.    . Nutritional Supplements (FEEDING SUPPLEMENT, JEVITY 1.5 CAL,) LIQD Place 237 mLs into feeding tube continuous. Average of about 5 1/2 cans a day.    . pantoprazole (PROTONIX) 40 MG tablet Take 40 mg by mouth daily.     . tamsulosin (FLOMAX) 0.4 MG CAPS capsule Take 1 capsule (0.4 mg total) by mouth daily. (Patient taking differently: Take 0.4 mg by mouth at bedtime.) 30 capsule 1  . ticagrelor (BRILINTA) 90 MG TABS tablet Take 90 mg by mouth 2 (two) times daily.     . TRUE METRIX BLOOD GLUCOSE TEST test strip      No current facility-administered medications for this visit.    Review of Systems  Constitutional: Positive for malaise/fatigue.  HENT: Positive for sore throat.   Respiratory: Negative.   Cardiovascular: Negative.   Gastrointestinal: Positive for abdominal pain.  Neurological: Negative.      PHYSICAL EXAMINATION: BP 119/63 (BP Location: Left Arm, Patient Position: Sitting, Cuff Size: Normal)   Pulse 94   Resp 20   Ht _0  (1.702 m)   Wt 166 lb 12.8 oz (75.7 kg)   SpO2 93% Comment: RA  BMI 26.12 kg/m  Physical Exam Constitutional:      General: He is not in acute distress.    Appearance: He is ill-appearing. He is not toxic-appearing.  HENT:     Head: Normocephalic and atraumatic.  Eyes:     Extraocular Movements: Extraocular movements intact.  Cardiovascular:     Rate and Rhythm: Normal rate.  Pulmonary:      Effort: Pulmonary effort is normal. No respiratory distress.  Abdominal:       Comments: Gastrostomy tube in the left upper quadrant  Musculoskeletal:     Cervical back: Normal range of motion.  Neurological:     Mental Status: He is alert.     Diagnostic Studies & Laboratory data:    CT Scan: 04/02/2020.  Soft tissue thickening at the GE junction.  No evidence of metastatic disease.  Left lung base GGO concerning for infection or aspiration.  Moderate right-sided pleural effusion.  Coronary artery disease.  EGD/EUS: 01/04/2020. ENDOSCOPIC FINDING: Findings: A large, submucosal and ulcerating mass was found at the gastroesophageal junction, 37 cm from the incisors. The mass was partially obstructing and circumferential. I could not pass the diagnostic endoscope through the tumor stricture. ENDOSONOGRAPHIC FINDING: An oval intramural (subepithelial) lesion was found in the gastroesophageal junction. Views were limited, as the EUS scope was unable to traverse the tumor stricture. The lesion was hypoechoic. Sonographically, the origin appeared to be within the luminal interface/superficial mucosa (Layer 1). The lesion also appeared to involve the following wall layer(s): muscularis propria (Layer 4). Mindful of the limitations of exam, no obvious periesophageal or peritumoral lymphadenopathy was seen.  Radiation Hx: Last dose of radiation November 20th 2021.     I have independently reviewed the above radiology studies  and reviewed the findings with the patient.   Recent Lab Findings: Lab Results  Component Value Date   WBC 4.1 03/15/2020   HGB 7.9 (A) 03/15/2020   HCT 24 (A) 03/15/2020   PLT 212  03/15/2020   GLUCOSE 199 (H) 01/31/2020   ALT 30 03/15/2020   AST 33 03/15/2020   NA 134 (A) 03/15/2020   K 4.0 03/15/2020   CL 99 03/15/2020   CREATININE 0.8 03/15/2020   BUN 17 03/15/2020   CO2 28 (A) 03/15/2020      Problem List: T3 N0 M0 stage IIb adenocarcinoma the GE  junction. Recent history of V. fib arrest.  Status post drug-eluting stent placement x2, currently on Brilinta 9 weeks out from last dose of radiation Open gastrostomy tube Significant weight loss   Assessment / Plan:   75 year old male presents with a locally advanced adenocarcinoma of the GE junction.  He was diagnosed in early fall and has already completed his chemotherapy and radiation.  He unfortunately is almost out of his radiation window to safely perform this resection.  Additionally he has a very recent history of V. fib arrest and has been placed on Brilinta for his drug-eluting stents.  He stated that his cardiologist would be unwilling to take him off of the Brilinta which would significantly increase his risk of bleeding especially given the likelihood that he has dense scar from his radiation at this point.  Additionally he has had an open gastrostomy tube, and other prior abdominal operations which would make minimally invasive approach unlikely.  Given all of the circumstances I explained that this operation would be very high risk for him.  If he is unable to come off of his Brilinta then surgery would definitely be prohibitive.  I will speak with his oncologist, but he may be best served at an academic institution.     I  spent 40 minutes with the patient face to face counseling and coordination of care.    Lajuana Matte 04/17/2020 3:47 PM

## 2020-04-17 NOTE — Telephone Encounter (Signed)
04/17/20 All appts rs

## 2020-04-18 ENCOUNTER — Inpatient Hospital Stay: Payer: Medicare HMO

## 2020-04-18 ENCOUNTER — Encounter: Payer: Self-pay | Admitting: Oncology

## 2020-04-18 ENCOUNTER — Inpatient Hospital Stay (INDEPENDENT_AMBULATORY_CARE_PROVIDER_SITE_OTHER): Payer: Medicare HMO | Admitting: Oncology

## 2020-04-18 VITALS — BP 116/56 | HR 77 | Temp 97.8°F | Resp 18 | Ht 67.0 in | Wt 171.4 lb

## 2020-04-18 DIAGNOSIS — D649 Anemia, unspecified: Secondary | ICD-10-CM | POA: Diagnosis not present

## 2020-04-18 DIAGNOSIS — M4317 Spondylolisthesis, lumbosacral region: Secondary | ICD-10-CM | POA: Diagnosis not present

## 2020-04-18 DIAGNOSIS — C155 Malignant neoplasm of lower third of esophagus: Secondary | ICD-10-CM | POA: Diagnosis not present

## 2020-04-18 LAB — BASIC METABOLIC PANEL
BUN: 20 (ref 4–21)
CO2: 29 — AB (ref 13–22)
Chloride: 99 (ref 99–108)
Creatinine: 0.7 (ref 0.6–1.3)
Glucose: 246
Potassium: 3.8 (ref 3.4–5.3)
Sodium: 134 — AB (ref 137–147)

## 2020-04-18 LAB — HEPATIC FUNCTION PANEL
ALT: 27 (ref 10–40)
AST: 39 (ref 14–40)
Alkaline Phosphatase: 108 (ref 25–125)
Bilirubin, Total: 0.6

## 2020-04-18 LAB — CBC AND DIFFERENTIAL
HCT: 32 — AB (ref 41–53)
Hemoglobin: 10.7 — AB (ref 13.5–17.5)
Neutrophils Absolute: 3.85
Platelets: 171 (ref 150–399)
WBC: 4.7

## 2020-04-18 LAB — COMPREHENSIVE METABOLIC PANEL
Albumin: 3.5 (ref 3.5–5.0)
Calcium: 8.9 (ref 8.7–10.7)

## 2020-04-18 LAB — CBC: RBC: 3.24 — AB (ref 3.87–5.11)

## 2020-04-18 NOTE — Progress Notes (Signed)
Jeremiah Ray  [address] [place],  [place]  [address] [phone]  Clinic Day:  04/18/2020  Referring physician: Nicoletta Dress, MD   This document serves as a record of services personally performed by Jeremiah Poisson, MD. It was created on their behalf by [facility] E, a trained medical scribe. The creation of this record is based on the scribe's personal observations and the provider's statements to them.   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  Barium swallow study from August 4th was consistent with stricture of the distal esophagus and so endoscopy was pursued on August 18th by Dr. Nehemiah Settle.  This revealed a 1.0-1.5 cm mass of at the EG junction.  Surgical pathology was consistent with moderately differentiated adenocarcinoma.  HER2 testing was negative.  CT chest, abdomen and pelvis from August 26th was negative for evidence of metastatic disease.  Soft tissue fullness was seen in the region of the esophagogastric junction, although discrete mass lesion was not visible by CT.  Small to moderate right pleural effusion with right basilar atelectasis was also observed.  Tiny left pulmonary nodules, likely benign, and bilateral renal cysts are noted.  Upper EUS from October 6th revealed a partially obstructing, malignant esophageal tumor was found at the gastroesophageal junction.  It appeared to originate from within the luminal interface/superficial mucosa (Layer 1) and extend in parts through the muscularis propria. A tissue diagnosis was obtained prior to this exam consistent with adenocarcinoma.  He did have a myocardial infarction on June 19th 2021, and had to have coronary angioplasty with 2 stents.   He has been placed on Brilinta 90 mg daily and continues with cardiac rehabilitation.  He also continues aspirin 81 mg daily.  PET imaging from September 9th revealed moderate FDG uptake in the distal esophagus/GE junction mass consistent with known neoplasm.  No locoregional lymphadenopathy involving the mediastinum or upper abdomen or findings of distant metastatic disease were observed.  He was no longer able to swallow food or medications and relies on his feeding tube.  He had severe constipation despite multiple doses of magnesium citrate and was seen in the emergency room as he developed urinary detention and a catheter was placed.  He followed up with Dr. [name].  He is now using Miralax daily.  He completed radiation on November 20th, and 6 cycles of weekly paclitaxel/carboplatin on November 24th. CT chest, abdomen and pelvis from January 4th revealed stable ill-defined soft tissue thickening about the gastroesophageal junction without discrete mass and no evidence of metastatic disease in the chest, abdomen, or pelvis.  There is new heterogeneous and ground-glass airspace opacity in the left lung base, concerning for infection or aspiration.  The moderate right pleural effusion with associated atelectasis or consolidation remains stable.  He was treated with Levaquin 500 mg for 7 days, and this was repeated.    INTERVAL HISTORY:  He is here for routine follow up after being seen by Dr. [name] for surgical consultation yesterday.  Unfortunately resection at this point would be highly invasive and he would need to hold his Brilinta prior to surgery, but this would need to be cleared by his cardiologist first.  However, the patient feels that Dr. [name] is not comfortable with the procedure, and  informed them that he would have an 80% chance of not surviving.  They wish to undergo a second opinion and so we will refer him to thoracic surgery at [facility].  He continues to have  dysphagia and odynophagia.  He also notes shortness of breath and some congestion.  His hemoglobin has improved from 9.7 to 10.7, and his white count and platelets are normal.  Chemistries are unremarkable.  Non-fasting blood glucose is 246.  His  appetite is good, and he has gained 2 and 1/2 pounds since his last visit.  He denies fever, chills or other signs of infection.  He denies nausea, vomiting, bowel issues, or abdominal pain.  He denies sore throat, cough, dyspnea, or chest pain.  REVIEW OF SYSTEMS:  Review of Systems  Constitutional: Negative.   HENT:   Positive for trouble swallowing.        Odynophagia; Congestion  Eyes: Negative.   Respiratory: Positive for shortness of breath.   Cardiovascular: Negative.   Gastrointestinal: Negative.   Endocrine: Negative.   Genitourinary: Negative.    Musculoskeletal: Negative.   Skin: Negative.   Neurological: Negative.   Hematological: Negative.   Psychiatric/Behavioral: Negative.   All other systems reviewed and are negative.    VITALS:  Blood pressure (!) 116/56, pulse 77, temperature 97.8 F (36.6 C), temperature source Oral, resp. rate 18, height _0  (1.702 m), weight 171 lb 6.4 oz (77.7 kg), SpO2 93 %.  Wt Readings from Last 3 Encounters:  04/18/20 171 lb 6.4 oz (77.7 kg)  04/17/20 166 lb 12.8 oz (75.7 kg)  04/05/20 169 lb (76.7 kg)    Body mass index is 26.85 kg/m.  Performance status (ECOG): 1 - Symptomatic but completely ambulatory  PHYSICAL EXAM:  Physical Exam Constitutional:      General: He is not in acute distress.    Appearance: Normal appearance. He is normal weight.  HENT:     Head: Normocephalic and atraumatic.  Eyes:     General: No scleral icterus.    Extraocular Movements: Extraocular movements intact.     Conjunctiva/sclera: Conjunctivae normal.     Pupils: Pupils are equal, round, and reactive to light.  Cardiovascular:     Rate and Rhythm: Normal rate and regular rhythm.     Pulses: Normal  pulses.     Heart sounds: Normal heart sounds. No murmur heard. No friction rub. No gallop.   Pulmonary:     Effort: Pulmonary effort is normal. No respiratory distress.     Breath sounds: Decreased breath sounds (right base) present.     Comments: Crackles at the left base.  Abdominal:     General: Bowel sounds are normal. There is no distension.     Palpations: Abdomen is soft. There is no hepatomegaly, splenomegaly or mass.     Tenderness: There is no abdominal tenderness.  Musculoskeletal:        General: Normal range of motion.     Cervical back: Normal range of motion and neck supple.     Right lower leg: No edema.     Left lower leg: No edema.  Lymphadenopathy:     Cervical: No cervical adenopathy.  Skin:    General: Skin is warm and dry.  Neurological:     General: No focal deficit present.     Mental Status: He is alert and oriented to person, place, and time. Mental status is at baseline.  Psychiatric:        Mood  and Affect: Mood normal.        Behavior: Behavior normal.        Thought Content: Thought content normal.        Judgment: Judgment normal.    LABS:    CBC Latest Ref Rng & Units 03/15/2020 03/06/2020 02/20/2020  WBC - 4.1 3.3 2.0  Hemoglobin 13.5 - 17.5 7.9(A) 7.8(A) 9.3(A)  Hematocrit 41 - 53 24(A) 23(A) 27(A)  Platelets 150 - 399 212 135(A) 143(A)   CMP Latest Ref Rng & Units 03/15/2020 03/06/2020 02/20/2020  Glucose 70 - 99 mg/dL - - -  BUN 4 - _0 26(A)  Creatinine 0.6 - 1.3 0.8 0.7 0.8  Sodium 137 - 147 134(A) 133(A) 134(A)  Potassium 3.4 - 5.3 4.0 3.7 4.3  Chloride 99 - 108 99 99 99  CO2 13 - 22 28(A) 28(A) 31(A)  Calcium 8.7 - 10.7 8.7 8.7 9.1  Total Protein 6.5 - 8.1 g/dL - - -  Total Bilirubin 0.3 - 1.2 mg/dL - - -  Alkaline Phos 25 - 125 86 72 59  AST 14 - 40 33 32 26  ALT 10 - 40 _1 STUDIES:   CT chest, abdomen and pelvis with contrast on 04/03/2020 showed: 1. Unchanged, ill-defined soft tissue thickening about  the gastroesophageal junction without discrete mass. 2. No evidence of metastatic disease in the chest, abdomen, or pelvis. 3. There is new heterogeneous and ground-glass airspace opacity in the left lung base, concerning for infection or aspiration. 4. Unchanged moderate right pleural effusion with associated atelectasis or consolidation. No obvious pleural thickening or nodularity. Attention on follow-up. 5. Status post interval percutaneous gastrostomy. 6. Coronary artery disease.  Aortic Atherosclerosis (ICD10-I70.0).   Allergies: No Known Allergies  Current Medications: Current Outpatient Medications  Medication Sig Dispense Refill  . amiodarone (PACERONE) 200 MG tablet Take 200 mg by mouth daily.     Marland Kitchen amLODipine (NORVASC) 10 MG tablet Take 10 mg by mouth at bedtime.     Marland Kitchen aspirin 81 MG chewable tablet Chew 81 mg by mouth daily. Chewable tablet daily instead of the regular tablets    . benazepril (LOTENSIN) 40 MG tablet Take 40 mg by mouth at bedtime.     . Blood Glucose Monitoring Suppl (TRUE METRIX AIR GLUCOSE METER) w/Device KIT Use as directed.    . ferrous sulfate 325 (65 FE) MG tablet Take 325 mg by mouth at bedtime.    Marland Kitchen levofloxacin (LEVAQUIN) 500 MG tablet Take 1 tablet (500 mg total) by mouth daily for 10 days. 10 tablet 0  . magic mouthwash w/lidocaine SOLN Take 5 mLs by mouth 4 (four) times daily as needed for mouth pain (swish and swallow 45m). Equal parts of lidocaine, nystatin, mylanta and benadryl Called into [facility] [place] [address]    . metFORMIN (GLUCOPHAGE) 500 MG tablet Take 500 mg by mouth daily.    . metoprolol succinate (TOPROL-XL) 50 MG 24 hr tablet Take 50 mg by mouth daily.     . nitroGLYCERIN (NITROSTAT) 0.4 MG SL tablet Place 0.4 mg under the tongue as needed.    . Nutritional Supplements (FEEDING SUPPLEMENT, JEVITY 1.5 CAL,) LIQD Place 237 mLs into feeding tube continuous. Average of about 5 1/2 cans a day.    . pantoprazole (PROTONIX) 40 MG tablet Take 40  mg by mouth daily.     . tamsulosin (FLOMAX) 0.4 MG CAPS capsule Take 1 capsule (0.4 mg total) by mouth daily. (Patient  taking differently: Take 0.4 mg by mouth at bedtime.) 30 capsule 1  . ticagrelor (BRILINTA) 90 MG TABS tablet Take 90 mg by mouth 2 (two) times daily.     . TRUE METRIX BLOOD GLUCOSE TEST test strip      No current facility-administered medications for this visit.     ASSESSMENT & PLAN:   Assessment:   1.  Stage III (T3N0M0) adenocarcinoma of the GE junction.  This mass measures 1.5 cm.  CT imaging was negative for obvious metastatic disease, and a PET was helpful for accurate staging, especially in view of the pleural effusion.  Testing for HER2 was negative.    2.  History of myocardial infarction back in June 2021, associated with ventricular fibrillation.  He required coronary angioplasty with 2 stents.  He has been placed on Brilinta 90 mg daily, which he has been recommended to continue for at least 1 year.   3.  History of blood clots of the left leg.  He continues aspirin 81 mg daily.  4.  Mild right pleural effusion.  Thoracentesis yielded 1 L of fluid consistent with reactive mesothelial cells, but the pleural effusion has persisted.   5. Anemia, much improved.  Evaluation was consistent with iron deficiency and he is on oral supplement.   6. Feeding tube placed due to dysphagia and odynophagia.  He continues regular feedings and fluid intake.  Oral medications are also taken through the feeding tube.  He is unable to even swallow his saliva.  7.   Worsening heart burn likely due to esophagitis from radiation.  It has now been 2 months and I would have expected improvement from the radiation, so this may represent refractory adenocarcinoma.  8.  Reported elevated PSA of 5.1 at his last visit with Dr. [name].  He states that he has had an enlarged prostate.  He will have a repeat in 3 months.  Plan: The patient continues to wish to pursue surgical resection.  We  did review the possible complications, and this would be a high risk procedure. I would suggest that he be seen in consultation at one of the academic institutions such as [facility] and so we will make the referral to thoracic surgery.  They are in agreement.  PET imaging has been rescheduled for January 31st due to the weather conditions.  We will see him back on February 1st with CBC and CMP for repeat evaluation.  He understands and agrees with this plan of care.  I have answered their questions and they know to call with any concerns.  I provided 30 minutes of face-to-face time during this this encounter and > 50% was spent counseling as documented under my assessment and plan.    Derwood Kaplan, MD [facility] [place]   I, Rita Ohara, am acting as scribe for Derwood Kaplan, MD  I have reviewed this report as typed by the medical scribe, and it is complete and accurate.

## 2020-04-19 ENCOUNTER — Inpatient Hospital Stay: Payer: Medicare HMO | Admitting: Oncology

## 2020-04-19 ENCOUNTER — Telehealth: Payer: Self-pay

## 2020-04-19 ENCOUNTER — Other Ambulatory Visit: Payer: Medicare HMO

## 2020-04-19 ENCOUNTER — Other Ambulatory Visit: Payer: Self-pay | Admitting: Hematology and Oncology

## 2020-04-19 NOTE — Telephone Encounter (Signed)
Faxed all information to [facility] for appointment.

## 2020-04-19 NOTE — Telephone Encounter (Signed)
-----   Message from Derwood Kaplan, MD sent at 04/18/2020  2:40 PM EST ----- Regarding: referral Pls refer Thoracic surgeon at [facility] for adenocarcinoma of [place], has completed radiation and chemotherapy but still odynophagia and persistent mass, have ordered PET scan, pls see ASAP

## 2020-04-24 DIAGNOSIS — R0600 Dyspnea, unspecified: Secondary | ICD-10-CM | POA: Diagnosis not present

## 2020-04-24 DIAGNOSIS — C159 Malignant neoplasm of esophagus, unspecified: Secondary | ICD-10-CM | POA: Diagnosis not present

## 2020-04-25 ENCOUNTER — Telehealth: Payer: Self-pay

## 2020-04-25 NOTE — Telephone Encounter (Signed)
Called [facility] to check on referral appointment. Left VM to return my call to either ext [phone] or [phone] to let us know when this appointment is scheduled for.

## 2020-04-25 NOTE — Telephone Encounter (Signed)
-----   Message from Amy W Castlebury, RN sent at 04/25/2020  9:22 AM EST ----- Regarding: Appt date with [facility]?? I see you documented the referral documents were faxed to [facility] on 04/19/20. Can you check with them to see if appt scheduled?? Dr McCarty states it needs to be sooner rather than later. Hopefully, you still have your documents, as I didn't see them scanned into Epic.  

## 2020-04-26 ENCOUNTER — Telehealth: Payer: Self-pay

## 2020-04-26 NOTE — Telephone Encounter (Signed)
-----   Message from Christine H McCarty, MD sent at 04/18/2020  2:40 PM EST ----- Regarding: referral Pls refer Thoracic surgeon at [facility] for adenocarcinoma of GE jct, has completed radiation and chemotherapy but still odynophagia and persistent mass, have ordered PET scan, pls see ASAP  

## 2020-04-26 NOTE — Telephone Encounter (Signed)
Appointment with Dr. Charlann Lange with PFT's on May 02, 2020 at 1000 for PFT's and 1300 for Dr. Charlann Lange.

## 2020-04-26 NOTE — Telephone Encounter (Signed)
-----   Message from Dairl Ponder, RN sent at 04/25/2020  9:22 AM EST ----- Regarding: Appt date with [facility]?? I see you documented the referral documents were faxed to [place] on 04/19/20. Can you check with them to see if appt scheduled?? Dr Hinton Rao states it needs to be sooner rather than later. Hopefully, you still have your documents, as I didn't see them scanned into Epic.

## 2020-04-26 NOTE — Telephone Encounter (Signed)
Called [facility] to check on appointments. Appointment with PFT's at 1000 on May 02, 2020. Dr. Charlann Lange is May 02, 2020 at 1300. Called patient to make sure he was aware and they are aware of the date and time of the appointment.

## 2020-04-30 ENCOUNTER — Encounter: Payer: Self-pay | Admitting: Oncology

## 2020-04-30 DIAGNOSIS — C159 Malignant neoplasm of esophagus, unspecified: Secondary | ICD-10-CM | POA: Diagnosis not present

## 2020-04-30 DIAGNOSIS — C155 Malignant neoplasm of lower third of esophagus: Secondary | ICD-10-CM | POA: Diagnosis not present

## 2020-04-30 NOTE — Progress Notes (Incomplete)
[facility]  [address] [place],  [place]  [address] [phone]  Clinic Day:  04/30/2020  Referring physician: Nicoletta Dress, MD   This document serves as a record of services personally performed by [name], MD. It was created on their behalf by [facility] E, a trained medical scribe. The creation of this record is based on the scribe's personal observations and the provider's statements to them.   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  Barium swallow study from August 4th was consistent with stricture of the distal esophagus and so endoscopy was pursued on August 18th by Dr. Nehemiah Settle.  This revealed a 1.0-1.5 cm mass of at the EG junction.  Surgical pathology was consistent with moderately differentiated adenocarcinoma.  HER2 testing was negative.  CT chest, abdomen and pelvis from August 26th was negative for evidence of metastatic disease.  Soft tissue fullness was seen in the region of the esophagogastric junction, although discrete mass lesion was not visible by CT.  Small to moderate right pleural effusion with right basilar atelectasis was also observed.  Tiny left pulmonary nodules, likely benign, and bilateral renal cysts are noted.  Upper EUS from October 6th revealed a partially obstructing, malignant esophageal tumor was found at the gastroesophageal junction.  It appeared to originate from within the luminal interface/superficial mucosa (Layer 1) and extend in parts through the muscularis propria. A tissue diagnosis was obtained prior to this exam consistent with adenocarcinoma.  He did have a myocardial infarction on June 19th 2021, and had to have coronary angioplasty with 2 stents.   He has been placed on Brilinta 90 mg daily and continues with cardiac rehabilitation.  He also continues aspirin 81 mg daily.  PET imaging from September 9th revealed moderate FDG uptake in the distal esophagus/GE junction mass consistent with known neoplasm.  No locoregional lymphadenopathy involving the mediastinum or upper abdomen or findings of distant metastatic disease were observed.  He was no longer able to swallow food or medications and relies on his feeding tube.  He had severe constipation despite multiple doses of magnesium citrate and was seen in the emergency room as he developed urinary detention and a catheter was placed.  He followed up with Dr. [name].  He is now using Miralax daily.  He completed radiation on November 20th, and 6 cycles of weekly paclitaxel/carboplatin on November 24th. CT chest, abdomen and pelvis from January 4th revealed stable ill-defined soft tissue thickening about the gastroesophageal junction without discrete mass and no evidence of metastatic disease in the chest, abdomen, or pelvis.  There is new heterogeneous and ground-glass airspace opacity in the left lung base, concerning for infection or aspiration.  The moderate right pleural effusion with associated atelectasis or consolidation remains stable.  He was treated with Levaquin 500 mg for 7 days, and this was repeated.    INTERVAL HISTORY:  He is here for routine follow up after being seen by Dr. [name] for surgical consultation yesterday.  Unfortunately resection at this point would be highly invasive and he would need to hold his Brilinta prior to surgery, but this would need to be cleared by his cardiologist first.  However, the patient feels that Dr. [name] is not comfortable with the procedure, and  informed them that he would have an 80% chance of not surviving.  They wish to undergo a second opinion and so we will refer him to thoracic surgery at [facility].  He continues to have  dysphagia and odynophagia.  He also notes shortness of breath and some congestion.  His hemoglobin has improved from 9.7 to 10.7, and his white count and platelets are normal.  Chemistries are unremarkable.  Non-fasting blood glucose is 246.  His  appetite is good, and he has gained 2 and 1/2 pounds since his last visit.  He denies fever, chills or other signs of infection.  He denies nausea, vomiting, bowel issues, or abdominal pain.  He denies sore throat, cough, dyspnea, or chest pain.  He is here for routine follow up ***.     His  appetite is good, and he has gained/lost _ pounds since his last visit.  He denies fever, chills or other signs of infection.  He denies nausea, vomiting, bowel issues, or abdominal pain.  He denies sore throat, cough, dyspnea, or chest pain.  REVIEW OF SYSTEMS:  Review of Systems - Oncology   VITALS:  There were no vitals taken for this visit.  Wt Readings from Last 3 Encounters:  04/18/20 171 lb 6.4 oz (77.7 kg)  04/17/20 166 lb 12.8 oz (75.7 kg)  04/05/20 169 lb (76.7 kg)    There is no height or weight on file to calculate BMI.  Performance status (ECOG): 1 - Symptomatic but completely ambulatory  PHYSICAL EXAM:  Physical Exam LABS:    CBC Latest Ref Rng & Units 04/18/2020 03/15/2020 03/06/2020  WBC - 4.7 4.1 3.3  Hemoglobin 13.5 - 17.5 10.7(A) 7.9(A) 7.8(A)  Hematocrit 41 - 53 32(A) 24(A) 23(A)  Platelets 150 - 399 171 212 135(A)   CMP Latest Ref Rng & Units 04/18/2020 03/15/2020 03/06/2020  Glucose 70 - 99 mg/dL - - -  BUN 4 - _0 Creatinine 0.6 - 1.3 0.7 0.8 0.7  Sodium 137 - 147 134(A) 134(A) 133(A)  Potassium 3.4 - 5.3 3.8 4.0 3.7  Chloride 99 - 108 99 99 99  CO2 13 - 22 29(A) 28(A) 28(A)  Calcium 8.7 - 10.7 8.9 8.7 8.7  Total Protein 6.5 - 8.1 g/dL - - -  Total Bilirubin 0.3 - 1.2 mg/dL - - -  Alkaline Phos 25 - 125 108 86 72  AST 14 - 40 39 33 32  ALT 10 - 40 _1 STUDIES:   CT chest, abdomen and pelvis with  contrast on 04/03/2020 showed: 1. Unchanged, ill-defined soft tissue thickening about the gastroesophageal junction without discrete mass. 2. No evidence of metastatic disease in the chest, abdomen, or pelvis. 3. There is new heterogeneous and ground-glass airspace opacity in the left lung base, concerning for infection or aspiration. 4. Unchanged moderate right pleural effusion with associated atelectasis or consolidation. No obvious pleural thickening or nodularity. Attention on follow-up. 5. Status post interval percutaneous gastrostomy. 6. Coronary artery disease.  Aortic Atherosclerosis (ICD10-I70.0).   Allergies: No Known Allergies  Current Medications: Current Outpatient Medications  Medication Sig Dispense Refill  . amiodarone (PACERONE) 200 MG tablet Take 200 mg by mouth daily.     Marland Kitchen amLODipine (NORVASC) 10 MG tablet Take 10 mg by mouth at bedtime.     Marland Kitchen aspirin 81 MG chewable tablet Chew 81 mg by mouth daily. Chewable tablet daily instead of the regular tablets    .  benazepril (LOTENSIN) 40 MG tablet Take 40 mg by mouth at bedtime.     . Blood Glucose Monitoring Suppl (TRUE METRIX AIR GLUCOSE METER) w/Device KIT Use as directed.    . ferrous sulfate 325 (65 FE) MG tablet Take 325 mg by mouth at bedtime.    . magic mouthwash w/lidocaine SOLN Take 5 mLs by mouth 4 (four) times daily as needed for mouth pain (swish and swallow 69m). Equal parts of lidocaine, nystatin, mylanta and benadryl Called into [facility] [address]    . metFORMIN (GLUCOPHAGE) 500 MG tablet Take 500 mg by mouth daily.    . metoprolol succinate (TOPROL-XL) 50 MG 24 hr tablet Take 50 mg by mouth daily.     . nitroGLYCERIN (NITROSTAT) 0.4 MG SL tablet Place 0.4 mg under the tongue as needed.    . Nutritional Supplements (FEEDING SUPPLEMENT, JEVITY 1.5 CAL,) LIQD Place 237 mLs into feeding tube continuous. Average of about 5 1/2 cans a day.    . pantoprazole (PROTONIX) 40 MG tablet Take 40 mg by mouth daily.     .  tamsulosin (FLOMAX) 0.4 MG CAPS capsule Take 1 capsule (0.4 mg total) by mouth daily. (Patient taking differently: Take 0.4 mg by mouth at bedtime.) 30 capsule 1  . ticagrelor (BRILINTA) 90 MG TABS tablet Take 90 mg by mouth 2 (two) times daily.     . TRUE METRIX BLOOD GLUCOSE TEST test strip      No current facility-administered medications for this visit.     ASSESSMENT & PLAN:   Assessment:   1.  Stage III (T3N0M0) adenocarcinoma of the GE junction.  This mass measures 1.5 cm.  CT imaging was negative for obvious metastatic disease, and a PET was helpful for accurate staging, especially in view of the pleural effusion.  Testing for HER2 was negative.    2.  History of myocardial infarction back in June 2021, associated with ventricular fibrillation.  He required coronary angioplasty with 2 stents.  He has been placed on Brilinta 90 mg daily, which he has been recommended to continue for at least 1 year.   3.  History of blood clots of the left leg.  He continues aspirin 81 mg daily.  4.  Mild right pleural effusion.  Thoracentesis yielded 1 L of fluid consistent with reactive mesothelial cells, but the pleural effusion has persisted.   5. Anemia, much improved.  Evaluation was consistent with iron deficiency and he is on oral supplement.   6. Feeding tube placed due to dysphagia and odynophagia.  He continues regular feedings and fluid intake.  Oral medications are also taken through the feeding tube.  He is unable to even swallow his saliva.  7.   Worsening heart burn likely due to esophagitis from radiation.  It has now been 2 months and I would have expected improvement from the radiation, so this may represent refractory adenocarcinoma.  8.  Reported elevated PSA of 5.1 at his last visit with Dr. CNila Nephew  He states that he has had an enlarged prostate.  He will have a repeat in 3 months.  Plan: The patient continues to wish to pursue surgical resection.  We did review the possible  complications, and this would be a high risk procedure. I would suggest that he be seen in consultation at one of the academic institutions such [facility] so we will make the referral to thoracic surgery.  They are in agreement.  PET imaging has been rescheduled for January  31st due to the weather conditions.  We will see him back on February 1st with CBC and CMP for repeat evaluation.  He understands and agrees with this plan of care.  I have answered their questions and they know to call with any concerns.  I provided 30 minutes of face-to-face time during this this encounter and > 50% was spent counseling as documented under my assessment and plan.    Derwood Kaplan, MD [facility] [place]   I, Rita Ohara, am acting as scribe for Derwood Kaplan, MD  I have reviewed this report as typed by the medical scribe, and it is complete and accurate.

## 2020-05-01 ENCOUNTER — Inpatient Hospital Stay: Payer: Medicare HMO

## 2020-05-01 ENCOUNTER — Telehealth: Payer: Self-pay | Admitting: Hematology and Oncology

## 2020-05-01 ENCOUNTER — Inpatient Hospital Stay: Payer: Medicare HMO | Admitting: Oncology

## 2020-05-01 DIAGNOSIS — C155 Malignant neoplasm of lower third of esophagus: Secondary | ICD-10-CM

## 2020-05-01 NOTE — Telephone Encounter (Signed)
Per 21 Staff Msg, patient rescheduled for 2/4 Labs 8:00 am - Follow Up 8:30 am  Patient aware that he has Lab Appt

## 2020-05-02 ENCOUNTER — Telehealth: Payer: Self-pay | Admitting: Oncology

## 2020-05-02 DIAGNOSIS — Z7901 Long term (current) use of anticoagulants: Secondary | ICD-10-CM | POA: Diagnosis not present

## 2020-05-02 DIAGNOSIS — Z87891 Personal history of nicotine dependence: Secondary | ICD-10-CM | POA: Diagnosis not present

## 2020-05-02 DIAGNOSIS — I469 Cardiac arrest, cause unspecified: Secondary | ICD-10-CM | POA: Diagnosis not present

## 2020-05-02 DIAGNOSIS — Z23 Encounter for immunization: Secondary | ICD-10-CM | POA: Diagnosis not present

## 2020-05-02 DIAGNOSIS — E119 Type 2 diabetes mellitus without complications: Secondary | ICD-10-CM | POA: Diagnosis not present

## 2020-05-02 DIAGNOSIS — C159 Malignant neoplasm of esophagus, unspecified: Secondary | ICD-10-CM | POA: Diagnosis not present

## 2020-05-02 DIAGNOSIS — R9431 Abnormal electrocardiogram [ECG] [EKG]: Secondary | ICD-10-CM | POA: Diagnosis not present

## 2020-05-02 DIAGNOSIS — I10 Essential (primary) hypertension: Secondary | ICD-10-CM | POA: Diagnosis not present

## 2020-05-02 DIAGNOSIS — Z01818 Encounter for other preprocedural examination: Secondary | ICD-10-CM | POA: Diagnosis not present

## 2020-05-02 DIAGNOSIS — I252 Old myocardial infarction: Secondary | ICD-10-CM | POA: Diagnosis not present

## 2020-05-02 DIAGNOSIS — R918 Other nonspecific abnormal finding of lung field: Secondary | ICD-10-CM | POA: Diagnosis not present

## 2020-05-02 DIAGNOSIS — D500 Iron deficiency anemia secondary to blood loss (chronic): Secondary | ICD-10-CM | POA: Diagnosis not present

## 2020-05-02 DIAGNOSIS — Z8674 Personal history of sudden cardiac arrest: Secondary | ICD-10-CM | POA: Diagnosis not present

## 2020-05-02 DIAGNOSIS — C155 Malignant neoplasm of lower third of esophagus: Secondary | ICD-10-CM | POA: Diagnosis not present

## 2020-05-02 DIAGNOSIS — Z79899 Other long term (current) drug therapy: Secondary | ICD-10-CM | POA: Diagnosis not present

## 2020-05-02 NOTE — Telephone Encounter (Signed)
05/02/20 Lft msg and rs appt

## 2020-05-03 DIAGNOSIS — I350 Nonrheumatic aortic (valve) stenosis: Secondary | ICD-10-CM | POA: Diagnosis not present

## 2020-05-03 DIAGNOSIS — E782 Mixed hyperlipidemia: Secondary | ICD-10-CM | POA: Diagnosis not present

## 2020-05-03 DIAGNOSIS — Z7982 Long term (current) use of aspirin: Secondary | ICD-10-CM | POA: Diagnosis not present

## 2020-05-03 DIAGNOSIS — I10 Essential (primary) hypertension: Secondary | ICD-10-CM | POA: Diagnosis not present

## 2020-05-03 DIAGNOSIS — I2510 Atherosclerotic heart disease of native coronary artery without angina pectoris: Secondary | ICD-10-CM | POA: Diagnosis not present

## 2020-05-03 NOTE — Progress Notes (Signed)
[facility]  [address] [place],  [place]  [address] [phone]  Clinic Day:  05/04/2020  Referring physician: Nicoletta Dress, MD   This document serves as a record of services personally performed by [name], MD. It was created on their behalf by [facility], a trained medical scribe. The creation of this record is based on the scribe's personal observations and the provider's statements to them.   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  Barium swallow study from August 4th was consistent with stricture of the distal esophagus and so endoscopy was pursued on August 18th by Dr. Nehemiah Settle.  This revealed a 1.0-1.5 cm mass of at the EG junction.  Surgical pathology was consistent with moderately differentiated adenocarcinoma.  HER2 testing was negative.  CT chest, abdomen and pelvis from August 26th was negative for evidence of metastatic disease.  Soft tissue fullness was seen in the region of the esophagogastric junction, although discrete mass lesion was not visible by CT.  Small to moderate right pleural effusion with right basilar atelectasis was also observed.  Tiny left pulmonary nodules, likely benign, and bilateral renal cysts are noted.  Upper EUS from October 6th revealed a partially obstructing, malignant esophageal tumor was found at the gastroesophageal junction.  It appeared to originate from within the luminal interface/superficial mucosa (Layer 1) and extend in parts through the muscularis propria. A tissue diagnosis was obtained prior to this exam consistent with adenocarcinoma.  He did have a myocardial infarction on June 19th 2021, and had to have coronary angioplasty with 2 stents.   He has been placed on Brilinta 90 mg daily and continues with cardiac rehabilitation.  He also continues aspirin 81 mg daily.  A diagnostic thoracentesis was performed and pleural fluid cytology was negative.  PET imaging from September 9th revealed moderate FDG uptake in the distal esophagus/GE junction mass consistent with known neoplasm.  No locoregional lymphadenopathy involving the mediastinum or upper abdomen or findings of distant metastatic disease were observed.  He was no longer able to swallow food or medications and relies on his feeding tube.  He had severe constipation despite multiple doses of magnesium citrate and was seen in the emergency room as he developed urinary detention and a catheter was placed.  He followed up with Dr. [name].  He is now using Miralax daily.  He completed radiation on November 20th, and 6 cycles of weekly paclitaxel/carboplatin on November 24th. CT chest, abdomen and pelvis from January 4th revealed stable ill-defined soft tissue thickening about the gastroesophageal junction without discrete mass and no evidence of metastatic disease in the chest, abdomen, or pelvis.  There is new heterogeneous and ground-glass airspace opacity in the left lung base, concerning for infection or aspiration.  The moderate right pleural effusion with associated atelectasis or consolidation remains stable.  He did not have a good experience with Dr. [name] the thoracic surgeon and so was referred to Dr. Wynelle Cleveland at [facility].  INTERVAL HISTORY:  He is here for routine follow up and to review imaging results.  PET imaging from January 31st revealed a decrease in metabolic activity through the GE junction, with no evidence of metastatic esophageal carcinoma.  There is increasing airspace consolidation in the medial left lower  lobe presumably related to radiation treatment.  There is also interval increase in large right pleural effusion also presumed related to radiation treatment, as it does not  show any hypermetabolic activity on PET.  He was seen at [facility] Wednesday by Dr. Felicie Morn for surgical consultation who felt he was appropriate for resection.  He is scheduled for endoscopy with possible esophageal dilatation and biopsies on February 10th to confirm if they can proceed with resection.  He also plans to do a thoracostomy tube for drainage and probable pleural biopsies.  He has met with his cardiologist and he has been taking off of [place].  He continues to struggle with dysphagia and odynophasia and still can't swallow his own saliva.  His hemoglobin is stable at 10.7, and his white count and platelets are normal.  Chemistries are unremarkable except for a BUN of 23 and a glucose of 280.  His  appetite is good, and he has gained 1 pound since his last visit.  He denies fever, chills or other signs of infection.  He denies nausea, vomiting, bowel issues, or abdominal pain.  He denies sore throat, cough, dyspnea, or chest pain.  REVIEW OF SYSTEMS:  Review of Systems  Constitutional: Negative.   HENT:   Positive for trouble swallowing.        Odynophagia; Congestion  Eyes: Negative.   Cardiovascular: Negative.   Gastrointestinal: Negative.   Endocrine: Negative.   Genitourinary: Negative.    Musculoskeletal: Negative.   Skin: Negative.   Neurological: Negative.   Hematological: Negative.   Psychiatric/Behavioral: Negative.   All other systems reviewed and are negative.    VITALS:  Blood pressure (!) 119/59, pulse 60, temperature 98.4 F (36.9 C), temperature source Oral, resp. rate 18, height _0  (1.702 m), weight 172 lb 8 oz (78.2 kg), SpO2 94 %.  Wt Readings from Last 3 Encounters:  05/04/20 172 lb 8 oz (78.2 kg)  04/18/20 171 lb 6.4 oz (77.7 kg)  04/17/20 166 lb 12.8 oz (75.7 kg)    Body mass index is 27.02 kg/m.  Performance status (ECOG): 1 - Symptomatic but completely ambulatory  PHYSICAL EXAM:  Physical Exam Constitutional:      General: He is not in  acute distress.    Appearance: Normal appearance. He is normal weight.  HENT:     Head: Normocephalic and atraumatic.     Mouth/Throat:     Comments: He is unable to swallow his own saliva Eyes:     General: No scleral icterus.    Extraocular Movements: Extraocular movements intact.     Conjunctiva/sclera: Conjunctivae normal.     Pupils: Pupils are equal, round, and reactive to light.  Cardiovascular:     Rate and Rhythm: Normal rate and regular rhythm.     Pulses: Normal pulses.     Heart sounds: Normal heart sounds. No murmur heard. No friction rub. No gallop.   Pulmonary:     Effort: Pulmonary effort is normal. No respiratory distress.     Breath sounds: Decreased breath sounds (at the right base) present.     Comments: He has a few crackles at the left base Abdominal:     General: Bowel sounds are normal. There is no distension.     Palpations: Abdomen is soft. There is no mass.     Tenderness: There is no abdominal tenderness.     Comments: Gastrostomy tube in the left upper quadrant which looks normal.  Musculoskeletal:  General: Normal range of motion.     Cervical back: Normal range of motion and neck supple.     Right lower leg: No edema.     Left lower leg: No edema.  Lymphadenopathy:     Cervical: No cervical adenopathy.  Skin:    General: Skin is warm and dry.  Neurological:     General: No focal deficit present.     Mental Status: He is alert and oriented to person, place, and time. Mental status is at baseline.  Psychiatric:        Mood and Affect: Mood normal.        Behavior: Behavior normal.        Thought Content: Thought content normal.        Judgment: Judgment normal.    LABS:    CBC Latest Ref Rng & Units 04/18/2020 03/15/2020 03/06/2020  WBC - 4.7 4.1 3.3  Hemoglobin 13.5 - 17.5 10.7(A) 7.9(A) 7.8(A)  Hematocrit 41 - 53 32(A) 24(A) 23(A)  Platelets 150 - 399 171 212 135(A)   CMP Latest Ref Rng & Units 04/18/2020 03/15/2020 03/06/2020   Glucose 70 - 99 mg/dL - - -  BUN 4 - _0 Creatinine 0.6 - 1.3 0.7 0.8 0.7  Sodium 137 - 147 134(A) 134(A) 133(A)  Potassium 3.4 - 5.3 3.8 4.0 3.7  Chloride 99 - 108 99 99 99  CO2 13 - 22 29(A) 28(A) 28(A)  Calcium 8.7 - 10.7 8.9 8.7 8.7  Total Protein 6.5 - 8.1 g/dL - - -  Total Bilirubin 0.3 - 1.2 mg/dL - - -  Alkaline Phos 25 - 125 108 86 72  AST 14 - 40 39 33 32  ALT 10 - 40 _1 STUDIES:   PET skull base to thigh from 04/30/2020 revealed: 1. Decrease in metabolic activity through the GE junction. 2. No evidence of metastatic esophageal carcinoma. 3. Increasing airspace consolidation in the medial LEFT lower lobe presumably related to radiation treatment.  Recommend clinical correlation for LEFT lower lobe pneumonia. 4. Interval increase in large RIGHT pleural effusion also presumed related to radiation treatment.   Allergies: No Known Allergies  Current Medications: Current Outpatient Medications  Medication Sig Dispense Refill  . albuterol (VENTOLIN HFA) 108 (90 Base) MCG/ACT inhaler INHALE 2 PUFFS BY MOUTH 4 TIMES DAILY    . amiodarone (PACERONE) 200 MG tablet Take 200 mg by mouth daily.     Marland Kitchen amLODipine (NORVASC) 10 MG tablet Take 10 mg by mouth at bedtime.     Marland Kitchen aspirin 81 MG chewable tablet Chew 81 mg by mouth daily. Chewable tablet daily instead of the regular tablets    . benazepril (LOTENSIN) 40 MG tablet Take 40 mg by mouth at bedtime.     . Blood Glucose Monitoring Suppl (TRUE METRIX AIR GLUCOSE METER) w/Device KIT Use as directed.    . ferrous sulfate 325 (65 FE) MG tablet Take 325 mg by mouth at bedtime.    Marland Kitchen glimepiride (AMARYL) 2 MG tablet 1 tablet with breakfast or the first main meal of the day    . guaiFENesin (MUCINEX) 600 MG 12 hr tablet Take by mouth.    . magic mouthwash w/lidocaine SOLN Take 5 mLs by mouth 4 (four) times daily as needed for mouth pain (swish and swallow 75m). Equal parts of lidocaine, nystatin, mylanta and  benadryl Called into [facility] [place] [address]    . metFORMIN (GLUCOPHAGE) 500 MG tablet  Take 500 mg by mouth daily.    . metoprolol succinate (TOPROL-XL) 50 MG 24 hr tablet Take 50 mg by mouth daily.     . nitroGLYCERIN (NITROSTAT) 0.4 MG SL tablet Place 0.4 mg under the tongue as needed.    . Nutritional Supplements (FEEDING SUPPLEMENT, JEVITY 1.5 CAL,) LIQD Place 237 mLs into feeding tube continuous. Average of about 5 1/2 cans a day.    . pantoprazole (PROTONIX) 40 MG tablet Take 40 mg by mouth daily.     . tamsulosin (FLOMAX) 0.4 MG CAPS capsule Take 1 capsule (0.4 mg total) by mouth daily. (Patient taking differently: Take 0.4 mg by mouth at bedtime.) 30 capsule 1  . TRUE METRIX BLOOD GLUCOSE TEST test strip      No current facility-administered medications for this visit.     ASSESSMENT & PLAN:   Assessment:   1.  Stage III (T3N0M0) adenocarcinoma of the GE junction.  This mass measures 1.5 cm.  CT imaging was negative for obvious metastatic disease, and a PET was negative for any other sites of disease, including the pleural effusion.  Testing for HER2 was negative.  Repeat PET at this time shows decreased uptake at the distal esophagus with no other areas positive.  2.  History of myocardial infarction back in June 2021, associated with ventricular fibrillation.  He required coronary angioplasty with 2 stents.  He was placed on Brilinta 90 mg daily which has been discontinued.  3.  History of blood clots of the left leg.  He continues aspirin 81 mg daily.  4.  Large right pleural effusion, increased.  Prior thoracentesis yielded 1 L of fluid consistent with reactive mesothelial cells, but the pleural effusion has persisted.   5. Anemia, stable.  Evaluation was consistent with iron deficiency and he is on oral supplement.   6. Feeding tube placed due to dysphagia and odynophagia.  He continues regular feedings and fluid intake.  Oral medications are also taken through the feeding tube.   He is unable to even swallow his saliva.  7.   Worsening heart burn likely due to esophagitis from radiation.  It has now been 2 months and I would have expected improvement from the radiation, so this may represent refractory adenocarcinoma or esophageal stricture.  8.  Reported elevated PSA of 5.1 at his last visit with Dr. [name].  He states that he has had an enlarged prostate.  He will have a repeat in 3 months.  Plan: PET imaging from January 31st revealed a decrease in metabolic activity through the GE junction, with no evidence of metastatic esophageal carcinoma.  There is increasing airspace consolidation in the medial left lower lobe presumably related to radiation treatment, as well as interval increase in large right pleural effusion also presumed related to radiation treatment.  He has been evaluated by Dr. Felicie Morn at [facility] for surgical consultation, and felt he was an appropriate candidate.  He is scheduled for endoscopy with possible esophageal dilatation and biopsy first on February 10th to confirm that he can proceed with surgical resection.  He will also have drainage and investigation of the enlarging right pleural effusion.  He has been taken off of Brilinta by his cardiologist.  We will see him back in 3 week with CBC and CMP for repeat evaluation.  He understands and agrees with this plan of care.  I have answered their questions and they know to call with any concerns.  I provided 30 minutes of  face-to-face time during this this encounter and > 50% was spent counseling as documented under my assessment and plan.    Derwood Kaplan, MD [facility] [place]   I, Rita Ohara, am acting as scribe for Derwood Kaplan, MD  I have reviewed this report as typed by the medical scribe, and it is complete and accurate.

## 2020-05-04 ENCOUNTER — Other Ambulatory Visit: Payer: Self-pay

## 2020-05-04 ENCOUNTER — Telehealth: Payer: Self-pay | Admitting: Oncology

## 2020-05-04 ENCOUNTER — Other Ambulatory Visit: Payer: Self-pay | Admitting: Hematology and Oncology

## 2020-05-04 ENCOUNTER — Other Ambulatory Visit: Payer: Medicare HMO

## 2020-05-04 ENCOUNTER — Inpatient Hospital Stay: Payer: Medicare HMO | Attending: Oncology

## 2020-05-04 ENCOUNTER — Encounter: Payer: Self-pay | Admitting: Oncology

## 2020-05-04 ENCOUNTER — Inpatient Hospital Stay (INDEPENDENT_AMBULATORY_CARE_PROVIDER_SITE_OTHER): Payer: Medicare HMO | Admitting: Oncology

## 2020-05-04 ENCOUNTER — Ambulatory Visit: Payer: Medicare HMO | Admitting: Oncology

## 2020-05-04 VITALS — BP 119/59 | HR 60 | Temp 98.4°F | Resp 18 | Ht 67.0 in | Wt 172.5 lb

## 2020-05-04 DIAGNOSIS — C155 Malignant neoplasm of lower third of esophagus: Secondary | ICD-10-CM | POA: Diagnosis not present

## 2020-05-04 DIAGNOSIS — D649 Anemia, unspecified: Secondary | ICD-10-CM | POA: Diagnosis not present

## 2020-05-04 LAB — CBC AND DIFFERENTIAL
HCT: 31 — AB (ref 41–53)
Hemoglobin: 10.7 — AB (ref 13.5–17.5)
Neutrophils Absolute: 4.2
Platelets: 202 (ref 150–399)
WBC: 5

## 2020-05-04 LAB — BASIC METABOLIC PANEL
BUN: 23 — AB (ref 4–21)
CO2: 28 — AB (ref 13–22)
Chloride: 99 (ref 99–108)
Creatinine: 0.7 (ref 0.6–1.3)
Glucose: 280
Potassium: 3.8 (ref 3.4–5.3)
Sodium: 136 — AB (ref 137–147)

## 2020-05-04 LAB — HEPATIC FUNCTION PANEL
ALT: 34 (ref 10–40)
AST: 41 — AB (ref 14–40)
Alkaline Phosphatase: 113 (ref 25–125)
Bilirubin, Total: 0.4

## 2020-05-04 LAB — COMPREHENSIVE METABOLIC PANEL
Albumin: 3.4 — AB (ref 3.5–5.0)
Calcium: 8.7 (ref 8.7–10.7)

## 2020-05-04 LAB — CBC: RBC: 3.22 — AB (ref 3.87–5.11)

## 2020-05-04 NOTE — Telephone Encounter (Signed)
Per 2/4 LOS, patient scheduled for 2/24 Labs, Follow Up.  Gave patient Appt Summary

## 2020-05-08 DIAGNOSIS — I2510 Atherosclerotic heart disease of native coronary artery without angina pectoris: Secondary | ICD-10-CM | POA: Diagnosis not present

## 2020-05-08 DIAGNOSIS — J948 Other specified pleural conditions: Secondary | ICD-10-CM | POA: Diagnosis not present

## 2020-05-08 DIAGNOSIS — J918 Pleural effusion in other conditions classified elsewhere: Secondary | ICD-10-CM | POA: Diagnosis not present

## 2020-05-08 DIAGNOSIS — K219 Gastro-esophageal reflux disease without esophagitis: Secondary | ICD-10-CM | POA: Diagnosis not present

## 2020-05-08 DIAGNOSIS — R1310 Dysphagia, unspecified: Secondary | ICD-10-CM | POA: Diagnosis not present

## 2020-05-08 DIAGNOSIS — J942 Hemothorax: Secondary | ICD-10-CM | POA: Diagnosis not present

## 2020-05-08 DIAGNOSIS — E119 Type 2 diabetes mellitus without complications: Secondary | ICD-10-CM | POA: Diagnosis not present

## 2020-05-08 DIAGNOSIS — C159 Malignant neoplasm of esophagus, unspecified: Secondary | ICD-10-CM | POA: Diagnosis not present

## 2020-05-08 DIAGNOSIS — I517 Cardiomegaly: Secondary | ICD-10-CM | POA: Diagnosis not present

## 2020-05-08 DIAGNOSIS — Z5309 Procedure and treatment not carried out because of other contraindication: Secondary | ICD-10-CM | POA: Diagnosis not present

## 2020-05-08 DIAGNOSIS — S2241XA Multiple fractures of ribs, right side, initial encounter for closed fracture: Secondary | ICD-10-CM | POA: Diagnosis not present

## 2020-05-08 DIAGNOSIS — K222 Esophageal obstruction: Secondary | ICD-10-CM | POA: Diagnosis not present

## 2020-05-08 DIAGNOSIS — R918 Other nonspecific abnormal finding of lung field: Secondary | ICD-10-CM | POA: Diagnosis not present

## 2020-05-08 DIAGNOSIS — Z452 Encounter for adjustment and management of vascular access device: Secondary | ICD-10-CM | POA: Diagnosis not present

## 2020-05-08 DIAGNOSIS — J90 Pleural effusion, not elsewhere classified: Secondary | ICD-10-CM | POA: Diagnosis not present

## 2020-05-08 DIAGNOSIS — I5022 Chronic systolic (congestive) heart failure: Secondary | ICD-10-CM | POA: Diagnosis not present

## 2020-05-08 DIAGNOSIS — C155 Malignant neoplasm of lower third of esophagus: Secondary | ICD-10-CM | POA: Diagnosis not present

## 2020-05-08 DIAGNOSIS — I110 Hypertensive heart disease with heart failure: Secondary | ICD-10-CM | POA: Diagnosis not present

## 2020-05-08 DIAGNOSIS — Z20822 Contact with and (suspected) exposure to covid-19: Secondary | ICD-10-CM | POA: Diagnosis not present

## 2020-05-18 DIAGNOSIS — J90 Pleural effusion, not elsewhere classified: Secondary | ICD-10-CM | POA: Diagnosis not present

## 2020-05-18 DIAGNOSIS — C159 Malignant neoplasm of esophagus, unspecified: Secondary | ICD-10-CM | POA: Diagnosis not present

## 2020-05-22 NOTE — Progress Notes (Signed)
[facility]  [address] [place],  [place]  [address] [phone]  Clinic Day:  05/24/2020  Referring physician: Nicoletta Dress, MD   This document serves as a record of services personally performed by [name], MD. It was created on their behalf by Curry,Lauren E, a trained medical scribe. The creation of this record is based on the scribe's personal observations and the provider's statements to them.   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  Barium swallow study from August 4th was consistent with stricture of the distal esophagus and so endoscopy was pursued on August 18th by Dr. Nehemiah Settle.  This revealed a 1.0-1.5 cm mass of at the EG junction.  Surgical pathology was consistent with moderately differentiated adenocarcinoma.  HER2 testing was negative.  CT chest, abdomen and pelvis from August 26th was negative for evidence of metastatic disease.  Soft tissue fullness was seen in the region of the esophagogastric junction, although discrete mass lesion was not visible by CT.  Small to moderate right pleural effusion with right basilar atelectasis was also observed.  Tiny left pulmonary nodules, likely benign, and bilateral renal cysts are noted.  Upper EUS from October 6th revealed a partially obstructing, malignant esophageal tumor was found at the gastroesophageal junction.  It appeared to originate from within the luminal interface/superficial mucosa (Layer 1) and extend in parts through the muscularis propria. A tissue diagnosis was obtained prior to this exam consistent with adenocarcinoma.  He did have a myocardial infarction on June 19th 2021, and had to have coronary angioplasty with 2 stents.   He has been placed on Brilinta 90 mg daily and continues with cardiac rehabilitation.  He also continues aspirin 81 mg daily.  A diagnostic thoracentesis was performed and pleural fluid cytology was negative.  PET imaging from September 9th revealed moderate FDG uptake in the distal esophagus/GE junction mass consistent with known neoplasm.  No locoregional lymphadenopathy involving the mediastinum or upper abdomen or findings of distant metastatic disease were observed.  He was no longer able to swallow food or medications and relies on his feeding tube.  He had severe constipation despite multiple doses of magnesium citrate and was seen in the emergency room as he developed urinary detention and a catheter was placed.  He followed up with Dr. [name].  He is now using Miralax daily.  He completed radiation on November 20th, and 6 cycles of weekly paclitaxel/carboplatin on November 24th. CT chest, abdomen and pelvis from January 4th revealed stable ill-defined soft tissue thickening about the gastroesophageal junction without discrete mass and no evidence of metastatic disease in the chest, abdomen, or pelvis.  There is new heterogeneous and ground-glass airspace opacity in the left lung base, concerning for infection or aspiration.  The moderate right pleural effusion with associated atelectasis or consolidation remains stable.  He did not have a good experience with Dr. [name] the thoracic surgeon and so was referred to Dr. Wynelle Cleveland at [facility]  PET imaging from January 31st revealed a decrease in metabolic activity through the GE junction, with no evidence of metastatic esophageal carcinoma.  There is increasing airspace consolidation in the medial left lower lobe presumably related to radiation treatment.  There is also interval increase in large right pleural  effusion also presumed related to radiation treatment, as it does not show any hypermetabolic activity on PET.  He was seen at [facility] Wednesday by Dr. Felicie Morn for surgical consultation who felt he was appropriate for resection.   INTERVAL HISTORY:  He is here for follow up after his procedure at [facility].  He underwent EGD with dilation and thoracoscopy with drainage of a hemithorax on February 10th with Dr. Felicie Morn.  Cytology from the pleural fluid was negative and revealed no evidence of malignancy.  This was felt to be an old hemothorax from prior rib fractures, which likely occurred at the time of his CPR last year.  Examination revealed there was no evidence of tumor below the GE junction on retroflexion. On pulling the scope back, this was a very tight structure which is still probably malignancy, and due to the dilation, no biopsies were taken.  On palpation, there was felt to be some lower lateral rib fractures.  He states that he is doing fairly well since his surgery and is able to at least swallow is saliva now.  He has a zoom meeting with Dr. Wynelle Cleveland again next week to discuss further surgical resection.  His hemoglobin is stable to mildly worse at 10.5, and his white count and platelets are normal.  Chemistries are unremarkable except for a blood glucose of 239.  His  appetite is good, and he has lost 2 pounds since his last visit.  He denies fever, chills or other signs of infection.  He denies nausea, vomiting, bowel issues, or abdominal pain.  He denies sore throat, cough, dyspnea, or chest pain.  REVIEW OF SYSTEMS:  Review of Systems  Constitutional: Negative.  Negative for appetite change, chills, fatigue, fever and unexpected weight change.  Eyes: Negative.   Respiratory: Negative.  Negative for chest tightness, cough, hemoptysis, shortness of breath and wheezing.   Cardiovascular: Negative.  Negative for chest pain, leg swelling and palpitations.  Gastrointestinal: Positive for diarrhea. Negative for abdominal distention, abdominal pain, blood in stool, constipation, nausea and vomiting.       Gas, worsened  Endocrine: Negative.    Genitourinary: Negative.  Negative for difficulty urinating, dysuria, frequency and hematuria.   Musculoskeletal: Negative for arthralgias, back pain, flank pain, gait problem and myalgias.  Skin: Negative.   Neurological: Negative.  Negative for dizziness, extremity weakness, gait problem, headaches, light-headedness, numbness, seizures and speech difficulty.  Hematological: Negative.   Psychiatric/Behavioral: Negative.  Negative for depression and sleep disturbance. The patient is not nervous/anxious.   All other systems reviewed and are negative.    VITALS:  Blood pressure 121/60, pulse 65, temperature 98.2 F (36.8 C), temperature source Oral, resp. rate 18, height _0  (1.702 m), weight 170 lb 12.8 oz (77.5 kg), SpO2 95 %.  Wt Readings from Last 3 Encounters:  05/24/20 170 lb 12.8 oz (77.5 kg)  05/04/20 172 lb 8 oz (78.2 kg)  04/18/20 171 lb 6.4 oz (77.7 kg)    Body mass index is 26.75 kg/m.  Performance status (ECOG): 1 - Symptomatic but completely ambulatory  PHYSICAL EXAM:  Physical Exam Constitutional:      General: He is not in acute distress.    Appearance: Normal appearance. He is normal weight.  HENT:     Head: Normocephalic and atraumatic.  Eyes:     General: No scleral icterus.    Extraocular Movements: Extraocular movements intact.     Conjunctiva/sclera: Conjunctivae normal.     Pupils: Pupils are equal,  round, and reactive to light.  Cardiovascular:     Rate and Rhythm: Normal rate and regular rhythm.     Pulses: Normal pulses.     Heart sounds: Normal heart sounds. No murmur heard. No friction rub. No gallop.   Pulmonary:     Effort: Pulmonary effort is normal. No respiratory distress.     Breath sounds: Decreased breath sounds (slight at the left base) present.  Abdominal:     General: Bowel sounds are normal. There is no distension.     Palpations: Abdomen is soft. There is no hepatomegaly, splenomegaly or mass.     Tenderness: There is no  abdominal tenderness.  Musculoskeletal:        General: Normal range of motion.     Cervical back: Normal range of motion and neck supple.     Right lower leg: No edema.     Left lower leg: No edema.  Lymphadenopathy:     Cervical: No cervical adenopathy.  Skin:    General: Skin is warm and dry.  Neurological:     General: No focal deficit present.     Mental Status: He is alert and oriented to person, place, and time. Mental status is at baseline.  Psychiatric:        Mood and Affect: Mood normal.        Behavior: Behavior normal.        Thought Content: Thought content normal.        Judgment: Judgment normal.    LABS:    CBC Latest Ref Rng & Units 05/04/2020 04/18/2020 03/15/2020  WBC - 5.0 4.7 4.1  Hemoglobin 13.5 - 17.5 10.7(A) 10.7(A) 7.9(A)  Hematocrit 41 - 53 31(A) 32(A) 24(A)  Platelets 150 - 399 202 171 212   CMP Latest Ref Rng & Units 05/04/2020 04/18/2020 03/15/2020  Glucose 70 - 99 mg/dL - - -  BUN 4 - 21 23(A) 20 17  Creatinine 0.6 - 1.3 0.7 0.7 0.8  Sodium 137 - 147 136(A) 134(A) 134(A)  Potassium 3.4 - 5.3 3.8 3.8 4.0  Chloride 99 - 108 99 99 99  CO2 13 - 22 28(A) 29(A) 28(A)  Calcium 8.7 - 10.7 8.7 8.9 8.7  Total Protein 6.5 - 8.1 g/dL - - -  Total Bilirubin 0.3 - 1.2 mg/dL - - -  Alkaline Phos 25 - 125 113 108 86  AST 14 - 40 41(A) 39 33  ALT 10 - 40 34 27 30     STUDIES:   Procedure(s): ESOPHAGOGASTRODUODENOSCOPY, FLEXIBLE, TRANSORAL; WITH INSERTION OF GUIDE WIRE FOLLOWED BY PASSAGE OF DILATOR(S) THROUGH ESOPHAGUS OVER GUIDE WIRE (N/A Esophagus), THORACOSCOPY; WITH DRAINAGE OF A HEMOTHORAX (Right Chest) Procedure Note   SURGEON: Surgeon(s) and Role: * Harpole, Tama Gander., MD - Primary * Sheilah Pigeon, Tillman Abide, MD - Fellow  ASSISTANT(S): NONE  STAFF: Circulator: Buckles, Danielle, RN; McEachin-Conl, Arlyss Queen, RN Scrub Person: Gordan Payment, RN; Cecilie Lowers, RN  ANESTHESIA: General   INDICATION(S): ESOPHAGEAL CANCER AND RIGHT PLEURAL  EFFUSION  OPERATIVE FINDINGS: COMPLETELY OBSTRUCTING DISTAL ESOPHAGEAL MASS, OLD HEMOTHORAX WITH MULTIPLE RIB FRACTURES  OPERATIVE REPORT: After adequate induction of anesthesia, patient placed on the operating table in supine position endoscopy was undertaken through the mouth. At 46 cm, a completely obstructing mass was encountered, difficulty was encountered passing the guidewire which eventually found a small lumen. The area was dilated to 26 [place] and the scope was able to be passed through it. The stomach and first portion of the  duodenum and pylorus were normal. There was no evidence of tumor below the GE junction on retroflexion. On pulling the scope back, this was a very tight stricture this is probably still malignant, however due to the dilation no biopsies taken. The patient was then turned to the left lower position and the right chest was prepped and sterilely draped. On palpation there felt to be some lower lateral rib fractures, and an incision was made in the seventh interspace the posterior axillary line 1 cm in length and a camera was placed in the chest. 1300 cc of what appeared to be old blood was removed, and a large bruise could be seen in the area of the palpable rib fractures. There was no evidence of metastatic disease and the pleura was completely clear. Therefore a 24 French chest tube was placed through the single 5 mm camera port entrance to skin with #1 nylon sutures. The lung inflated normally, all counts are correct, the patient tolerated procedure well.  ESTIMATED BLOOD LOSS: minimal  * No values recorded between 05/10/2020 10:07 AM and 05/10/2020 11:26 AM *  Chest Tube Bundle 1 Atrium Right;Soft;Straight;Pleural (Active)   TOTAL IV FLUIDS: Crystalloid 600 ml  SPECIMENS:  ID Type Source Tests Collected by Time Destination  1 : Pleural Fluid for Cytology Other-Cytology Pleural Fluid CYTOLOGY, NON-GYN OTHER Harpole, Tama Gander., MD 05/10/2020 1106  A : Pleural  Fluid for Cytology Body Fluid Pleural Fluid GRAM STAIN, CULTURE, BODY FLUID (AEROBIC AND ANAEROBIC) W GRAM STAIN, CULTURE, MYCOBACTERIA, NON-BLOOD, CULTURE, FUNGUS Harpole, Tama Gander., MD 05/10/2020 1108      Allergies: No Known Allergies  Current Medications: Current Outpatient Medications  Medication Sig Dispense Refill  . albuterol (VENTOLIN HFA) 108 (90 Base) MCG/ACT inhaler INHALE 2 PUFFS BY MOUTH 4 TIMES DAILY    . amiodarone (PACERONE) 200 MG tablet Take 200 mg by mouth daily.     Marland Kitchen amLODipine (NORVASC) 10 MG tablet Take 10 mg by mouth at bedtime.     Marland Kitchen aspirin 81 MG chewable tablet Chew 81 mg by mouth daily. Chewable tablet daily instead of the regular tablets    . benazepril (LOTENSIN) 40 MG tablet Take 40 mg by mouth at bedtime.     . Blood Glucose Monitoring Suppl (TRUE METRIX AIR GLUCOSE METER) w/Device KIT Use as directed.    . ferrous sulfate 325 (65 FE) MG tablet Take 325 mg by mouth at bedtime.    Marland Kitchen glimepiride (AMARYL) 2 MG tablet 1 tablet with breakfast or the first main meal of the day    . guaiFENesin (MUCINEX) 600 MG 12 hr tablet Take by mouth.    . magic mouthwash w/lidocaine SOLN Take 5 mLs by mouth 4 (four) times daily as needed for mouth pain (swish and swallow 19m). Equal parts of lidocaine, nystatin, mylanta and benadryl Called into [facility] [place] [address]    . metFORMIN (GLUCOPHAGE) 500 MG tablet Take 500 mg by mouth daily.    . metoprolol succinate (TOPROL-XL) 50 MG 24 hr tablet Take 50 mg by mouth daily.     . nitroGLYCERIN (NITROSTAT) 0.4 MG SL tablet Place 0.4 mg under the tongue as needed.    . Nutritional Supplements (FEEDING SUPPLEMENT, JEVITY 1.5 CAL,) LIQD Place 237 mLs into feeding tube continuous. Average of about 5 1/2 cans a day.    . pantoprazole (PROTONIX) 40 MG tablet Take 40 mg by mouth daily.     . tamsulosin (FLOMAX) 0.4 MG CAPS capsule Take 1 capsule (  0.4 mg total) by mouth daily. (Patient taking differently: Take 0.4 mg by mouth at bedtime.)  30 capsule 1  . TRUE METRIX BLOOD GLUCOSE TEST test strip      No current facility-administered medications for this visit.     ASSESSMENT & PLAN:   Assessment:   1.  Stage III (T3N0M0) adenocarcinoma of the GE junction.  This mass measures 1.5 cm.  CT imaging was negative for obvious metastatic disease, and a PET was negative for any other sites of disease, including the pleural effusion.  Testing for HER2 was negative.  Repeat PET at this time shows decreased uptake at the distal esophagus with no other areas positive.  He definitely still has some residual cancer and they plan surgical resection in March.  2.  History of myocardial infarction back in June 2021, associated with ventricular fibrillation.  He required coronary angioplasty with 2 stents.  He was placed on Brilinta 90 mg daily which has been discontinued.  3.  History of blood clots of the left leg.  He continues aspirin 81 mg daily.  4.  Large right pleural effusion, increased.  Prior thoracentesis yielded 1 L of fluid consistent with reactive mesothelial cells, but the pleural effusion has persisted.  In retrospect this probably represented a probable hemothorax from prior rib fractures which occurred from the time of his CPR.  5. Anemia, fairly stable.  Evaluation was consistent with iron deficiency and he is on oral supplement.   6. Feeding tube placed due to dysphagia and odynophagia.  He continues regular feedings and fluid intake.  Oral medications are also taken through the feeding tube.    7.   Heart burn likely due to esophagitis from radiation.    8.  Reported elevated PSA of 5.1 at his last visit with Dr. [name].  He states that he has had an enlarged prostate.  He will have a repeat in 3 months.  Plan: He underwent EGD with dilation on February 10th with Dr. Felicie Morn and is doing fairly well.  He is able to at least swallow his saliva now.  He is scheduled for a virtual meeting with Dr. Wynelle Cleveland again next  week to discuss surgical resection.  This will likely be pursued some time in March.  We will therefore see him back in early April with CBC and CMP for repeat evaluation and to review the final pathology.  He understands and agrees with this plan of care.  I have answered their questions and they know to call with any concerns.   I provided 30 minutes of face-to-face time during this this encounter and > 50% was spent counseling as documented under my assessment and plan.    Derwood Kaplan, MD [facility] [place]   I, Rita Ohara, am acting as scribe for Derwood Kaplan, MD  I have reviewed this report as typed by the medical scribe, and it is complete and accurate.

## 2020-05-24 ENCOUNTER — Other Ambulatory Visit: Payer: Self-pay | Admitting: Oncology

## 2020-05-24 ENCOUNTER — Inpatient Hospital Stay: Payer: Medicare HMO | Admitting: Oncology

## 2020-05-24 ENCOUNTER — Inpatient Hospital Stay: Payer: Medicare HMO

## 2020-05-24 ENCOUNTER — Other Ambulatory Visit: Payer: Self-pay

## 2020-05-24 ENCOUNTER — Telehealth: Payer: Self-pay | Admitting: Oncology

## 2020-05-24 ENCOUNTER — Encounter: Payer: Self-pay | Admitting: Oncology

## 2020-05-24 ENCOUNTER — Other Ambulatory Visit: Payer: Self-pay | Admitting: Hematology and Oncology

## 2020-05-24 VITALS — BP 121/60 | HR 65 | Temp 98.2°F | Resp 18 | Ht 67.0 in | Wt 170.8 lb

## 2020-05-24 DIAGNOSIS — C155 Malignant neoplasm of lower third of esophagus: Secondary | ICD-10-CM

## 2020-05-24 DIAGNOSIS — D649 Anemia, unspecified: Secondary | ICD-10-CM | POA: Diagnosis not present

## 2020-05-24 LAB — BASIC METABOLIC PANEL
BUN: 19 (ref 4–21)
CO2: 31 — AB (ref 13–22)
Chloride: 100 (ref 99–108)
Creatinine: 0.7 (ref 0.6–1.3)
Glucose: 239
Potassium: 4.3 (ref 3.4–5.3)
Sodium: 137 (ref 137–147)

## 2020-05-24 LAB — CBC AND DIFFERENTIAL
HCT: 31 — AB (ref 41–53)
Hemoglobin: 10.5 — AB (ref 13.5–17.5)
Neutrophils Absolute: 4.84
Platelets: 168 (ref 150–399)
WBC: 5.5

## 2020-05-24 LAB — HEPATIC FUNCTION PANEL
ALT: 26 (ref 10–40)
AST: 31 (ref 14–40)
Alkaline Phosphatase: 104 (ref 25–125)
Bilirubin, Total: 0.4

## 2020-05-24 LAB — CBC: RBC: 3.17 — AB (ref 3.87–5.11)

## 2020-05-24 LAB — COMPREHENSIVE METABOLIC PANEL
Albumin: 3.6 (ref 3.5–5.0)
Calcium: 8.7 (ref 8.7–10.7)

## 2020-05-24 NOTE — Telephone Encounter (Signed)
Per 2/24 LOS, patient scheduled for Aug Appt's.  Gave patient Appt Summary

## 2020-06-04 DIAGNOSIS — N390 Urinary tract infection, site not specified: Secondary | ICD-10-CM | POA: Diagnosis not present

## 2020-06-04 DIAGNOSIS — R339 Retention of urine, unspecified: Secondary | ICD-10-CM | POA: Diagnosis not present

## 2020-06-04 DIAGNOSIS — R972 Elevated prostate specific antigen [PSA]: Secondary | ICD-10-CM | POA: Diagnosis not present

## 2020-06-04 DIAGNOSIS — N401 Enlarged prostate with lower urinary tract symptoms: Secondary | ICD-10-CM | POA: Diagnosis not present

## 2020-06-13 DIAGNOSIS — Z01818 Encounter for other preprocedural examination: Secondary | ICD-10-CM | POA: Diagnosis not present

## 2020-06-13 DIAGNOSIS — C155 Malignant neoplasm of lower third of esophagus: Secondary | ICD-10-CM | POA: Diagnosis not present

## 2020-06-13 DIAGNOSIS — Z79899 Other long term (current) drug therapy: Secondary | ICD-10-CM | POA: Diagnosis not present

## 2020-06-13 DIAGNOSIS — Z955 Presence of coronary angioplasty implant and graft: Secondary | ICD-10-CM | POA: Diagnosis not present

## 2020-06-13 DIAGNOSIS — Z20822 Contact with and (suspected) exposure to covid-19: Secondary | ICD-10-CM | POA: Diagnosis not present

## 2020-06-13 DIAGNOSIS — Z7982 Long term (current) use of aspirin: Secondary | ICD-10-CM | POA: Diagnosis not present

## 2020-06-13 DIAGNOSIS — Z87891 Personal history of nicotine dependence: Secondary | ICD-10-CM | POA: Diagnosis not present

## 2020-06-13 DIAGNOSIS — Z8501 Personal history of malignant neoplasm of esophagus: Secondary | ICD-10-CM | POA: Diagnosis not present

## 2020-06-13 DIAGNOSIS — Z7984 Long term (current) use of oral hypoglycemic drugs: Secondary | ICD-10-CM | POA: Diagnosis not present

## 2020-06-14 ENCOUNTER — Telehealth: Payer: Self-pay

## 2020-06-14 DIAGNOSIS — R972 Elevated prostate specific antigen [PSA]: Secondary | ICD-10-CM | POA: Diagnosis not present

## 2020-06-14 DIAGNOSIS — E785 Hyperlipidemia, unspecified: Secondary | ICD-10-CM | POA: Diagnosis not present

## 2020-06-14 DIAGNOSIS — Z125 Encounter for screening for malignant neoplasm of prostate: Secondary | ICD-10-CM | POA: Diagnosis not present

## 2020-06-14 DIAGNOSIS — I2510 Atherosclerotic heart disease of native coronary artery without angina pectoris: Secondary | ICD-10-CM | POA: Diagnosis not present

## 2020-06-14 DIAGNOSIS — E1169 Type 2 diabetes mellitus with other specified complication: Secondary | ICD-10-CM | POA: Diagnosis not present

## 2020-06-14 DIAGNOSIS — I10 Essential (primary) hypertension: Secondary | ICD-10-CM | POA: Diagnosis not present

## 2020-06-14 DIAGNOSIS — Z6827 Body mass index (BMI) 27.0-27.9, adult: Secondary | ICD-10-CM | POA: Diagnosis not present

## 2020-06-14 NOTE — Telephone Encounter (Addendum)
Pt called back and spoke with Langley Gauss. She gave pt's wife the new appt.    ----- Message from Derwood Kaplan, MD sent at 06/14/2020  2:47 PM EDT ----- Regarding: RE: Surgery date 07/05/20, when would you like your f/u appt I would postpone appt to at least 6 weeks postop to give him time to recover ----- Message ----- From: Dairl Ponder, RN Sent: 06/14/2020  12:00 PM EDT To: Derwood Kaplan, MD Subject: Surgery date 07/05/20, when would you like you#  Pt's wife, called to make you aware that his total esophagectomy  Is scheduled for 07/05/2020. They have been told he will be in hospital 1-2 weeks. How far out would you like to move his appt? (He currently has appt for 07/02/20, but she didn't think you would want to keep that one).

## 2020-06-14 NOTE — Telephone Encounter (Signed)
Pt's wife, called to make you aware that his total esophagectomy  Is scheduled for 07/05/2020. They have been told he will be in hospital 1-2 weeks. How far out would you like to move his appt? (He currently has appt for 07/02/20, but she didn't think you would want to keep that one).

## 2020-06-28 ENCOUNTER — Telehealth: Payer: Self-pay

## 2020-06-28 ENCOUNTER — Other Ambulatory Visit: Payer: Self-pay | Admitting: Hematology and Oncology

## 2020-06-28 MED ORDER — CEPHALEXIN 500 MG PO CAPS: 500.0000 mg | ORAL_CAPSULE | Freq: Two times a day (BID) | ORAL | 0 refills | Status: AC

## 2020-06-28 NOTE — Telephone Encounter (Signed)
Spoke with Melissa CP, NP.  She is sending in a prescription for Keflex to prophylactic cover if indeed infected.  He is schedule for surgery on esophagus on 07/05/2020.  I also instructed him to call us on Monday if not better.

## 2020-07-02 ENCOUNTER — Other Ambulatory Visit: Payer: Medicare HMO

## 2020-07-02 ENCOUNTER — Ambulatory Visit: Payer: Medicare HMO | Admitting: Oncology

## 2020-07-04 DIAGNOSIS — J939 Pneumothorax, unspecified: Secondary | ICD-10-CM | POA: Diagnosis not present

## 2020-07-04 DIAGNOSIS — E1165 Type 2 diabetes mellitus with hyperglycemia: Secondary | ICD-10-CM | POA: Diagnosis not present

## 2020-07-04 DIAGNOSIS — R0902 Hypoxemia: Secondary | ICD-10-CM | POA: Diagnosis not present

## 2020-07-04 DIAGNOSIS — C155 Malignant neoplasm of lower third of esophagus: Secondary | ICD-10-CM | POA: Diagnosis not present

## 2020-07-04 DIAGNOSIS — D62 Acute posthemorrhagic anemia: Secondary | ICD-10-CM | POA: Diagnosis not present

## 2020-07-04 DIAGNOSIS — R1310 Dysphagia, unspecified: Secondary | ICD-10-CM | POA: Diagnosis not present

## 2020-07-04 DIAGNOSIS — C158 Malignant neoplasm of overlapping sites of esophagus: Secondary | ICD-10-CM | POA: Diagnosis not present

## 2020-07-04 DIAGNOSIS — K219 Gastro-esophageal reflux disease without esophagitis: Secondary | ICD-10-CM | POA: Diagnosis not present

## 2020-07-04 DIAGNOSIS — K668 Other specified disorders of peritoneum: Secondary | ICD-10-CM | POA: Diagnosis not present

## 2020-07-04 DIAGNOSIS — D500 Iron deficiency anemia secondary to blood loss (chronic): Secondary | ICD-10-CM | POA: Diagnosis not present

## 2020-07-04 DIAGNOSIS — J9602 Acute respiratory failure with hypercapnia: Secondary | ICD-10-CM | POA: Diagnosis not present

## 2020-07-04 DIAGNOSIS — D72829 Elevated white blood cell count, unspecified: Secondary | ICD-10-CM | POA: Diagnosis not present

## 2020-07-04 DIAGNOSIS — J8489 Other specified interstitial pulmonary diseases: Secondary | ICD-10-CM | POA: Diagnosis not present

## 2020-07-04 DIAGNOSIS — C49A0 Gastrointestinal stromal tumor, unspecified site: Secondary | ICD-10-CM | POA: Diagnosis not present

## 2020-07-04 DIAGNOSIS — I959 Hypotension, unspecified: Secondary | ICD-10-CM | POA: Diagnosis not present

## 2020-07-04 DIAGNOSIS — D899 Disorder involving the immune mechanism, unspecified: Secondary | ICD-10-CM | POA: Diagnosis not present

## 2020-07-04 DIAGNOSIS — J189 Pneumonia, unspecified organism: Secondary | ICD-10-CM | POA: Diagnosis not present

## 2020-07-04 DIAGNOSIS — I517 Cardiomegaly: Secondary | ICD-10-CM | POA: Diagnosis not present

## 2020-07-04 DIAGNOSIS — D689 Coagulation defect, unspecified: Secondary | ICD-10-CM | POA: Diagnosis not present

## 2020-07-04 DIAGNOSIS — I469 Cardiac arrest, cause unspecified: Secondary | ICD-10-CM | POA: Diagnosis not present

## 2020-07-04 DIAGNOSIS — E119 Type 2 diabetes mellitus without complications: Secondary | ICD-10-CM | POA: Diagnosis not present

## 2020-07-04 DIAGNOSIS — R491 Aphonia: Secondary | ICD-10-CM | POA: Diagnosis not present

## 2020-07-04 DIAGNOSIS — Z9049 Acquired absence of other specified parts of digestive tract: Secondary | ICD-10-CM | POA: Diagnosis not present

## 2020-07-04 DIAGNOSIS — T8182XA Emphysema (subcutaneous) resulting from a procedure, initial encounter: Secondary | ICD-10-CM | POA: Diagnosis not present

## 2020-07-04 DIAGNOSIS — I468 Cardiac arrest due to other underlying condition: Secondary | ICD-10-CM | POA: Diagnosis not present

## 2020-07-04 DIAGNOSIS — J9600 Acute respiratory failure, unspecified whether with hypoxia or hypercapnia: Secondary | ICD-10-CM | POA: Diagnosis not present

## 2020-07-04 DIAGNOSIS — I462 Cardiac arrest due to underlying cardiac condition: Secondary | ICD-10-CM | POA: Diagnosis not present

## 2020-07-04 DIAGNOSIS — G9340 Encephalopathy, unspecified: Secondary | ICD-10-CM | POA: Diagnosis not present

## 2020-07-04 DIAGNOSIS — I2693 Single subsegmental pulmonary embolism without acute cor pulmonale: Secondary | ICD-10-CM | POA: Diagnosis not present

## 2020-07-04 DIAGNOSIS — J9692 Respiratory failure, unspecified with hypercapnia: Secondary | ICD-10-CM | POA: Diagnosis not present

## 2020-07-04 DIAGNOSIS — Z9911 Dependence on respirator [ventilator] status: Secondary | ICD-10-CM | POA: Diagnosis not present

## 2020-07-04 DIAGNOSIS — J948 Other specified pleural conditions: Secondary | ICD-10-CM | POA: Diagnosis not present

## 2020-07-04 DIAGNOSIS — J90 Pleural effusion, not elsewhere classified: Secondary | ICD-10-CM | POA: Diagnosis not present

## 2020-07-04 DIAGNOSIS — E870 Hyperosmolality and hypernatremia: Secondary | ICD-10-CM | POA: Diagnosis not present

## 2020-07-04 DIAGNOSIS — A419 Sepsis, unspecified organism: Secondary | ICD-10-CM | POA: Diagnosis not present

## 2020-07-04 DIAGNOSIS — E8770 Fluid overload, unspecified: Secondary | ICD-10-CM | POA: Diagnosis not present

## 2020-07-04 DIAGNOSIS — I2510 Atherosclerotic heart disease of native coronary artery without angina pectoris: Secondary | ICD-10-CM | POA: Diagnosis not present

## 2020-07-04 DIAGNOSIS — J9690 Respiratory failure, unspecified, unspecified whether with hypoxia or hypercapnia: Secondary | ICD-10-CM | POA: Diagnosis not present

## 2020-07-04 DIAGNOSIS — Z66 Do not resuscitate: Secondary | ICD-10-CM | POA: Diagnosis not present

## 2020-07-04 DIAGNOSIS — R5381 Other malaise: Secondary | ICD-10-CM | POA: Diagnosis not present

## 2020-07-04 DIAGNOSIS — J9621 Acute and chronic respiratory failure with hypoxia: Secondary | ICD-10-CM | POA: Diagnosis not present

## 2020-07-04 DIAGNOSIS — I5021 Acute systolic (congestive) heart failure: Secondary | ICD-10-CM | POA: Diagnosis not present

## 2020-07-04 DIAGNOSIS — J95851 Ventilator associated pneumonia: Secondary | ICD-10-CM | POA: Diagnosis not present

## 2020-07-04 DIAGNOSIS — N19 Unspecified kidney failure: Secondary | ICD-10-CM | POA: Diagnosis not present

## 2020-07-04 DIAGNOSIS — Z4659 Encounter for fitting and adjustment of other gastrointestinal appliance and device: Secondary | ICD-10-CM | POA: Diagnosis not present

## 2020-07-04 DIAGNOSIS — Z8701 Personal history of pneumonia (recurrent): Secondary | ICD-10-CM | POA: Diagnosis not present

## 2020-07-04 DIAGNOSIS — G8918 Other acute postprocedural pain: Secondary | ICD-10-CM | POA: Diagnosis not present

## 2020-07-04 DIAGNOSIS — I480 Paroxysmal atrial fibrillation: Secondary | ICD-10-CM | POA: Diagnosis not present

## 2020-07-04 DIAGNOSIS — J151 Pneumonia due to Pseudomonas: Secondary | ICD-10-CM | POA: Diagnosis not present

## 2020-07-04 DIAGNOSIS — Z789 Other specified health status: Secondary | ICD-10-CM | POA: Diagnosis not present

## 2020-07-04 DIAGNOSIS — Z431 Encounter for attention to gastrostomy: Secondary | ICD-10-CM | POA: Diagnosis not present

## 2020-07-04 DIAGNOSIS — I739 Peripheral vascular disease, unspecified: Secondary | ICD-10-CM | POA: Diagnosis not present

## 2020-07-04 DIAGNOSIS — J9601 Acute respiratory failure with hypoxia: Secondary | ICD-10-CM | POA: Diagnosis not present

## 2020-07-04 DIAGNOSIS — G9349 Other encephalopathy: Secondary | ICD-10-CM | POA: Diagnosis not present

## 2020-07-04 DIAGNOSIS — I4891 Unspecified atrial fibrillation: Secondary | ICD-10-CM | POA: Diagnosis not present

## 2020-07-04 DIAGNOSIS — J850 Gangrene and necrosis of lung: Secondary | ICD-10-CM | POA: Diagnosis not present

## 2020-07-04 DIAGNOSIS — G4700 Insomnia, unspecified: Secondary | ICD-10-CM | POA: Diagnosis not present

## 2020-07-04 DIAGNOSIS — C772 Secondary and unspecified malignant neoplasm of intra-abdominal lymph nodes: Secondary | ICD-10-CM | POA: Diagnosis not present

## 2020-07-04 DIAGNOSIS — J9622 Acute and chronic respiratory failure with hypercapnia: Secondary | ICD-10-CM | POA: Diagnosis not present

## 2020-07-04 DIAGNOSIS — Z452 Encounter for adjustment and management of vascular access device: Secondary | ICD-10-CM | POA: Diagnosis not present

## 2020-07-04 DIAGNOSIS — K5900 Constipation, unspecified: Secondary | ICD-10-CM | POA: Diagnosis not present

## 2020-07-04 DIAGNOSIS — R4182 Altered mental status, unspecified: Secondary | ICD-10-CM | POA: Diagnosis not present

## 2020-07-04 DIAGNOSIS — I361 Nonrheumatic tricuspid (valve) insufficiency: Secondary | ICD-10-CM | POA: Diagnosis not present

## 2020-07-04 DIAGNOSIS — R6521 Severe sepsis with septic shock: Secondary | ICD-10-CM | POA: Diagnosis not present

## 2020-07-04 DIAGNOSIS — R918 Other nonspecific abnormal finding of lung field: Secondary | ICD-10-CM | POA: Diagnosis not present

## 2020-07-04 DIAGNOSIS — R509 Fever, unspecified: Secondary | ICD-10-CM | POA: Diagnosis not present

## 2020-07-04 DIAGNOSIS — J449 Chronic obstructive pulmonary disease, unspecified: Secondary | ICD-10-CM | POA: Diagnosis not present

## 2020-07-04 DIAGNOSIS — E861 Hypovolemia: Secondary | ICD-10-CM | POA: Diagnosis not present

## 2020-07-04 DIAGNOSIS — A498 Other bacterial infections of unspecified site: Secondary | ICD-10-CM | POA: Diagnosis not present

## 2020-07-04 DIAGNOSIS — R5081 Fever presenting with conditions classified elsewhere: Secondary | ICD-10-CM | POA: Diagnosis not present

## 2020-07-04 DIAGNOSIS — Z4682 Encounter for fitting and adjustment of non-vascular catheter: Secondary | ICD-10-CM | POA: Diagnosis not present

## 2020-07-04 DIAGNOSIS — J811 Chronic pulmonary edema: Secondary | ICD-10-CM | POA: Diagnosis not present

## 2020-07-04 DIAGNOSIS — M5416 Radiculopathy, lumbar region: Secondary | ICD-10-CM | POA: Diagnosis not present

## 2020-07-04 DIAGNOSIS — J95821 Acute postprocedural respiratory failure: Secondary | ICD-10-CM | POA: Diagnosis not present

## 2020-07-04 DIAGNOSIS — J9811 Atelectasis: Secondary | ICD-10-CM | POA: Diagnosis not present

## 2020-07-04 DIAGNOSIS — C159 Malignant neoplasm of esophagus, unspecified: Secondary | ICD-10-CM | POA: Diagnosis not present

## 2020-07-04 DIAGNOSIS — Z794 Long term (current) use of insulin: Secondary | ICD-10-CM | POA: Diagnosis not present

## 2020-07-04 DIAGNOSIS — I10 Essential (primary) hypertension: Secondary | ICD-10-CM | POA: Diagnosis not present

## 2020-07-04 DIAGNOSIS — C771 Secondary and unspecified malignant neoplasm of intrathoracic lymph nodes: Secondary | ICD-10-CM | POA: Diagnosis not present

## 2020-07-05 DIAGNOSIS — C771 Secondary and unspecified malignant neoplasm of intrathoracic lymph nodes: Secondary | ICD-10-CM | POA: Diagnosis not present

## 2020-07-05 DIAGNOSIS — C49A0 Gastrointestinal stromal tumor, unspecified site: Secondary | ICD-10-CM | POA: Diagnosis not present

## 2020-07-05 DIAGNOSIS — C158 Malignant neoplasm of overlapping sites of esophagus: Secondary | ICD-10-CM | POA: Diagnosis not present

## 2020-07-05 DIAGNOSIS — Z452 Encounter for adjustment and management of vascular access device: Secondary | ICD-10-CM | POA: Diagnosis not present

## 2020-07-05 DIAGNOSIS — T8182XA Emphysema (subcutaneous) resulting from a procedure, initial encounter: Secondary | ICD-10-CM | POA: Diagnosis not present

## 2020-07-05 DIAGNOSIS — C155 Malignant neoplasm of lower third of esophagus: Secondary | ICD-10-CM | POA: Diagnosis not present

## 2020-07-05 DIAGNOSIS — J8489 Other specified interstitial pulmonary diseases: Secondary | ICD-10-CM | POA: Diagnosis not present

## 2020-07-05 DIAGNOSIS — Z4682 Encounter for fitting and adjustment of non-vascular catheter: Secondary | ICD-10-CM | POA: Diagnosis not present

## 2020-07-05 DIAGNOSIS — C159 Malignant neoplasm of esophagus, unspecified: Secondary | ICD-10-CM | POA: Diagnosis not present

## 2020-07-05 DIAGNOSIS — C772 Secondary and unspecified malignant neoplasm of intra-abdominal lymph nodes: Secondary | ICD-10-CM | POA: Diagnosis not present

## 2020-07-05 DIAGNOSIS — R918 Other nonspecific abnormal finding of lung field: Secondary | ICD-10-CM | POA: Diagnosis not present

## 2020-07-06 DIAGNOSIS — J90 Pleural effusion, not elsewhere classified: Secondary | ICD-10-CM | POA: Diagnosis not present

## 2020-07-06 DIAGNOSIS — G8918 Other acute postprocedural pain: Secondary | ICD-10-CM | POA: Diagnosis not present

## 2020-07-06 DIAGNOSIS — C155 Malignant neoplasm of lower third of esophagus: Secondary | ICD-10-CM | POA: Diagnosis not present

## 2020-07-06 DIAGNOSIS — R918 Other nonspecific abnormal finding of lung field: Secondary | ICD-10-CM | POA: Diagnosis not present

## 2020-07-06 DIAGNOSIS — C159 Malignant neoplasm of esophagus, unspecified: Secondary | ICD-10-CM | POA: Diagnosis not present

## 2020-07-07 DIAGNOSIS — G8918 Other acute postprocedural pain: Secondary | ICD-10-CM | POA: Diagnosis not present

## 2020-07-08 DIAGNOSIS — G8918 Other acute postprocedural pain: Secondary | ICD-10-CM | POA: Diagnosis not present

## 2020-07-08 DIAGNOSIS — J9811 Atelectasis: Secondary | ICD-10-CM | POA: Diagnosis not present

## 2020-07-08 DIAGNOSIS — Z452 Encounter for adjustment and management of vascular access device: Secondary | ICD-10-CM | POA: Diagnosis not present

## 2020-07-08 DIAGNOSIS — J90 Pleural effusion, not elsewhere classified: Secondary | ICD-10-CM | POA: Diagnosis not present

## 2020-07-08 DIAGNOSIS — C155 Malignant neoplasm of lower third of esophagus: Secondary | ICD-10-CM | POA: Diagnosis not present

## 2020-07-08 DIAGNOSIS — Z4682 Encounter for fitting and adjustment of non-vascular catheter: Secondary | ICD-10-CM | POA: Diagnosis not present

## 2020-07-08 DIAGNOSIS — J948 Other specified pleural conditions: Secondary | ICD-10-CM | POA: Diagnosis not present

## 2020-07-08 DIAGNOSIS — R0902 Hypoxemia: Secondary | ICD-10-CM | POA: Diagnosis not present

## 2020-07-08 DIAGNOSIS — R918 Other nonspecific abnormal finding of lung field: Secondary | ICD-10-CM | POA: Diagnosis not present

## 2020-07-09 DIAGNOSIS — Z4682 Encounter for fitting and adjustment of non-vascular catheter: Secondary | ICD-10-CM | POA: Diagnosis not present

## 2020-07-09 DIAGNOSIS — Z9049 Acquired absence of other specified parts of digestive tract: Secondary | ICD-10-CM | POA: Diagnosis not present

## 2020-07-09 DIAGNOSIS — I959 Hypotension, unspecified: Secondary | ICD-10-CM | POA: Diagnosis not present

## 2020-07-09 DIAGNOSIS — E861 Hypovolemia: Secondary | ICD-10-CM | POA: Diagnosis not present

## 2020-07-09 DIAGNOSIS — D72829 Elevated white blood cell count, unspecified: Secondary | ICD-10-CM | POA: Diagnosis not present

## 2020-07-09 DIAGNOSIS — G8918 Other acute postprocedural pain: Secondary | ICD-10-CM | POA: Diagnosis not present

## 2020-07-09 DIAGNOSIS — E119 Type 2 diabetes mellitus without complications: Secondary | ICD-10-CM | POA: Diagnosis not present

## 2020-07-09 DIAGNOSIS — C159 Malignant neoplasm of esophagus, unspecified: Secondary | ICD-10-CM | POA: Diagnosis not present

## 2020-07-09 DIAGNOSIS — J9601 Acute respiratory failure with hypoxia: Secondary | ICD-10-CM | POA: Diagnosis not present

## 2020-07-09 DIAGNOSIS — D62 Acute posthemorrhagic anemia: Secondary | ICD-10-CM | POA: Diagnosis not present

## 2020-07-09 DIAGNOSIS — J90 Pleural effusion, not elsewhere classified: Secondary | ICD-10-CM | POA: Diagnosis not present

## 2020-07-09 DIAGNOSIS — G4700 Insomnia, unspecified: Secondary | ICD-10-CM | POA: Diagnosis not present

## 2020-07-10 DIAGNOSIS — E8770 Fluid overload, unspecified: Secondary | ICD-10-CM | POA: Diagnosis not present

## 2020-07-10 DIAGNOSIS — Z4682 Encounter for fitting and adjustment of non-vascular catheter: Secondary | ICD-10-CM | POA: Diagnosis not present

## 2020-07-10 DIAGNOSIS — I4891 Unspecified atrial fibrillation: Secondary | ICD-10-CM | POA: Diagnosis not present

## 2020-07-10 DIAGNOSIS — D62 Acute posthemorrhagic anemia: Secondary | ICD-10-CM | POA: Diagnosis not present

## 2020-07-10 DIAGNOSIS — J90 Pleural effusion, not elsewhere classified: Secondary | ICD-10-CM | POA: Diagnosis not present

## 2020-07-10 DIAGNOSIS — G4700 Insomnia, unspecified: Secondary | ICD-10-CM | POA: Diagnosis not present

## 2020-07-10 DIAGNOSIS — G8918 Other acute postprocedural pain: Secondary | ICD-10-CM | POA: Diagnosis not present

## 2020-07-10 DIAGNOSIS — D72829 Elevated white blood cell count, unspecified: Secondary | ICD-10-CM | POA: Diagnosis not present

## 2020-07-10 DIAGNOSIS — E119 Type 2 diabetes mellitus without complications: Secondary | ICD-10-CM | POA: Diagnosis not present

## 2020-07-10 DIAGNOSIS — R918 Other nonspecific abnormal finding of lung field: Secondary | ICD-10-CM | POA: Diagnosis not present

## 2020-07-10 DIAGNOSIS — C159 Malignant neoplasm of esophagus, unspecified: Secondary | ICD-10-CM | POA: Diagnosis not present

## 2020-07-10 DIAGNOSIS — J9601 Acute respiratory failure with hypoxia: Secondary | ICD-10-CM | POA: Diagnosis not present

## 2020-07-11 DIAGNOSIS — I517 Cardiomegaly: Secondary | ICD-10-CM | POA: Diagnosis not present

## 2020-07-11 DIAGNOSIS — I10 Essential (primary) hypertension: Secondary | ICD-10-CM | POA: Diagnosis not present

## 2020-07-11 DIAGNOSIS — Z4682 Encounter for fitting and adjustment of non-vascular catheter: Secondary | ICD-10-CM | POA: Diagnosis not present

## 2020-07-11 DIAGNOSIS — J9601 Acute respiratory failure with hypoxia: Secondary | ICD-10-CM | POA: Diagnosis not present

## 2020-07-11 DIAGNOSIS — D72829 Elevated white blood cell count, unspecified: Secondary | ICD-10-CM | POA: Diagnosis not present

## 2020-07-11 DIAGNOSIS — E119 Type 2 diabetes mellitus without complications: Secondary | ICD-10-CM | POA: Diagnosis not present

## 2020-07-11 DIAGNOSIS — G8918 Other acute postprocedural pain: Secondary | ICD-10-CM | POA: Diagnosis not present

## 2020-07-11 DIAGNOSIS — G4700 Insomnia, unspecified: Secondary | ICD-10-CM | POA: Diagnosis not present

## 2020-07-11 DIAGNOSIS — I4891 Unspecified atrial fibrillation: Secondary | ICD-10-CM | POA: Diagnosis not present

## 2020-07-11 DIAGNOSIS — J90 Pleural effusion, not elsewhere classified: Secondary | ICD-10-CM | POA: Diagnosis not present

## 2020-07-11 DIAGNOSIS — C159 Malignant neoplasm of esophagus, unspecified: Secondary | ICD-10-CM | POA: Diagnosis not present

## 2020-07-11 DIAGNOSIS — D62 Acute posthemorrhagic anemia: Secondary | ICD-10-CM | POA: Diagnosis not present

## 2020-07-12 DIAGNOSIS — I10 Essential (primary) hypertension: Secondary | ICD-10-CM | POA: Diagnosis not present

## 2020-07-12 DIAGNOSIS — Z794 Long term (current) use of insulin: Secondary | ICD-10-CM | POA: Diagnosis not present

## 2020-07-12 DIAGNOSIS — G4700 Insomnia, unspecified: Secondary | ICD-10-CM | POA: Diagnosis not present

## 2020-07-12 DIAGNOSIS — D62 Acute posthemorrhagic anemia: Secondary | ICD-10-CM | POA: Diagnosis not present

## 2020-07-12 DIAGNOSIS — G8918 Other acute postprocedural pain: Secondary | ICD-10-CM | POA: Diagnosis not present

## 2020-07-12 DIAGNOSIS — D72829 Elevated white blood cell count, unspecified: Secondary | ICD-10-CM | POA: Diagnosis not present

## 2020-07-12 DIAGNOSIS — E1165 Type 2 diabetes mellitus with hyperglycemia: Secondary | ICD-10-CM | POA: Diagnosis not present

## 2020-07-12 DIAGNOSIS — Z4682 Encounter for fitting and adjustment of non-vascular catheter: Secondary | ICD-10-CM | POA: Diagnosis not present

## 2020-07-12 DIAGNOSIS — J90 Pleural effusion, not elsewhere classified: Secondary | ICD-10-CM | POA: Diagnosis not present

## 2020-07-12 DIAGNOSIS — E119 Type 2 diabetes mellitus without complications: Secondary | ICD-10-CM | POA: Diagnosis not present

## 2020-07-12 DIAGNOSIS — C159 Malignant neoplasm of esophagus, unspecified: Secondary | ICD-10-CM | POA: Diagnosis not present

## 2020-07-12 DIAGNOSIS — I4891 Unspecified atrial fibrillation: Secondary | ICD-10-CM | POA: Diagnosis not present

## 2020-07-12 DIAGNOSIS — J9601 Acute respiratory failure with hypoxia: Secondary | ICD-10-CM | POA: Diagnosis not present

## 2020-07-13 DIAGNOSIS — I4891 Unspecified atrial fibrillation: Secondary | ICD-10-CM | POA: Diagnosis not present

## 2020-07-13 DIAGNOSIS — E119 Type 2 diabetes mellitus without complications: Secondary | ICD-10-CM | POA: Diagnosis not present

## 2020-07-13 DIAGNOSIS — I10 Essential (primary) hypertension: Secondary | ICD-10-CM | POA: Diagnosis not present

## 2020-07-13 DIAGNOSIS — J9601 Acute respiratory failure with hypoxia: Secondary | ICD-10-CM | POA: Diagnosis not present

## 2020-07-13 DIAGNOSIS — D72829 Elevated white blood cell count, unspecified: Secondary | ICD-10-CM | POA: Diagnosis not present

## 2020-07-13 DIAGNOSIS — C159 Malignant neoplasm of esophagus, unspecified: Secondary | ICD-10-CM | POA: Diagnosis not present

## 2020-07-13 DIAGNOSIS — G4700 Insomnia, unspecified: Secondary | ICD-10-CM | POA: Diagnosis not present

## 2020-07-13 DIAGNOSIS — G8918 Other acute postprocedural pain: Secondary | ICD-10-CM | POA: Diagnosis not present

## 2020-07-13 DIAGNOSIS — J90 Pleural effusion, not elsewhere classified: Secondary | ICD-10-CM | POA: Diagnosis not present

## 2020-07-13 DIAGNOSIS — Z4682 Encounter for fitting and adjustment of non-vascular catheter: Secondary | ICD-10-CM | POA: Diagnosis not present

## 2020-07-13 DIAGNOSIS — D62 Acute posthemorrhagic anemia: Secondary | ICD-10-CM | POA: Diagnosis not present

## 2020-07-13 DIAGNOSIS — J9811 Atelectasis: Secondary | ICD-10-CM | POA: Diagnosis not present

## 2020-07-14 DIAGNOSIS — I4891 Unspecified atrial fibrillation: Secondary | ICD-10-CM | POA: Diagnosis not present

## 2020-07-14 DIAGNOSIS — Z4682 Encounter for fitting and adjustment of non-vascular catheter: Secondary | ICD-10-CM | POA: Diagnosis not present

## 2020-07-14 DIAGNOSIS — D62 Acute posthemorrhagic anemia: Secondary | ICD-10-CM | POA: Diagnosis not present

## 2020-07-14 DIAGNOSIS — G8918 Other acute postprocedural pain: Secondary | ICD-10-CM | POA: Diagnosis not present

## 2020-07-14 DIAGNOSIS — I10 Essential (primary) hypertension: Secondary | ICD-10-CM | POA: Diagnosis not present

## 2020-07-14 DIAGNOSIS — J90 Pleural effusion, not elsewhere classified: Secondary | ICD-10-CM | POA: Diagnosis not present

## 2020-07-14 DIAGNOSIS — C159 Malignant neoplasm of esophagus, unspecified: Secondary | ICD-10-CM | POA: Diagnosis not present

## 2020-07-14 DIAGNOSIS — J9601 Acute respiratory failure with hypoxia: Secondary | ICD-10-CM | POA: Diagnosis not present

## 2020-07-14 DIAGNOSIS — G4700 Insomnia, unspecified: Secondary | ICD-10-CM | POA: Diagnosis not present

## 2020-07-14 DIAGNOSIS — E119 Type 2 diabetes mellitus without complications: Secondary | ICD-10-CM | POA: Diagnosis not present

## 2020-07-14 DIAGNOSIS — D72829 Elevated white blood cell count, unspecified: Secondary | ICD-10-CM | POA: Diagnosis not present

## 2020-07-15 DIAGNOSIS — Z4682 Encounter for fitting and adjustment of non-vascular catheter: Secondary | ICD-10-CM | POA: Diagnosis not present

## 2020-07-15 DIAGNOSIS — G8918 Other acute postprocedural pain: Secondary | ICD-10-CM | POA: Diagnosis not present

## 2020-07-15 DIAGNOSIS — J9601 Acute respiratory failure with hypoxia: Secondary | ICD-10-CM | POA: Diagnosis not present

## 2020-07-15 DIAGNOSIS — C159 Malignant neoplasm of esophagus, unspecified: Secondary | ICD-10-CM | POA: Diagnosis not present

## 2020-07-15 DIAGNOSIS — G4700 Insomnia, unspecified: Secondary | ICD-10-CM | POA: Diagnosis not present

## 2020-07-15 DIAGNOSIS — I4891 Unspecified atrial fibrillation: Secondary | ICD-10-CM | POA: Diagnosis not present

## 2020-07-15 DIAGNOSIS — J90 Pleural effusion, not elsewhere classified: Secondary | ICD-10-CM | POA: Diagnosis not present

## 2020-07-15 DIAGNOSIS — R918 Other nonspecific abnormal finding of lung field: Secondary | ICD-10-CM | POA: Diagnosis not present

## 2020-07-15 DIAGNOSIS — E119 Type 2 diabetes mellitus without complications: Secondary | ICD-10-CM | POA: Diagnosis not present

## 2020-07-15 DIAGNOSIS — I10 Essential (primary) hypertension: Secondary | ICD-10-CM | POA: Diagnosis not present

## 2020-07-15 DIAGNOSIS — D62 Acute posthemorrhagic anemia: Secondary | ICD-10-CM | POA: Diagnosis not present

## 2020-07-15 DIAGNOSIS — D72829 Elevated white blood cell count, unspecified: Secondary | ICD-10-CM | POA: Diagnosis not present

## 2020-07-16 DIAGNOSIS — C159 Malignant neoplasm of esophagus, unspecified: Secondary | ICD-10-CM | POA: Diagnosis not present

## 2020-07-16 DIAGNOSIS — J9602 Acute respiratory failure with hypercapnia: Secondary | ICD-10-CM | POA: Diagnosis not present

## 2020-07-16 DIAGNOSIS — Z9911 Dependence on respirator [ventilator] status: Secondary | ICD-10-CM | POA: Diagnosis not present

## 2020-07-16 DIAGNOSIS — C155 Malignant neoplasm of lower third of esophagus: Secondary | ICD-10-CM | POA: Diagnosis not present

## 2020-07-16 DIAGNOSIS — G8918 Other acute postprocedural pain: Secondary | ICD-10-CM | POA: Diagnosis not present

## 2020-07-16 DIAGNOSIS — R5381 Other malaise: Secondary | ICD-10-CM | POA: Diagnosis not present

## 2020-07-16 DIAGNOSIS — I5021 Acute systolic (congestive) heart failure: Secondary | ICD-10-CM | POA: Diagnosis not present

## 2020-07-16 DIAGNOSIS — J9600 Acute respiratory failure, unspecified whether with hypoxia or hypercapnia: Secondary | ICD-10-CM | POA: Diagnosis not present

## 2020-07-16 DIAGNOSIS — J9601 Acute respiratory failure with hypoxia: Secondary | ICD-10-CM | POA: Diagnosis not present

## 2020-07-16 DIAGNOSIS — R918 Other nonspecific abnormal finding of lung field: Secondary | ICD-10-CM | POA: Diagnosis not present

## 2020-07-16 DIAGNOSIS — J90 Pleural effusion, not elsewhere classified: Secondary | ICD-10-CM | POA: Diagnosis not present

## 2020-07-16 DIAGNOSIS — I480 Paroxysmal atrial fibrillation: Secondary | ICD-10-CM | POA: Diagnosis not present

## 2020-07-16 DIAGNOSIS — Z789 Other specified health status: Secondary | ICD-10-CM | POA: Diagnosis not present

## 2020-07-16 DIAGNOSIS — J9690 Respiratory failure, unspecified, unspecified whether with hypoxia or hypercapnia: Secondary | ICD-10-CM | POA: Diagnosis not present

## 2020-07-16 DIAGNOSIS — Z4682 Encounter for fitting and adjustment of non-vascular catheter: Secondary | ICD-10-CM | POA: Diagnosis not present

## 2020-07-17 DIAGNOSIS — G8918 Other acute postprocedural pain: Secondary | ICD-10-CM | POA: Diagnosis not present

## 2020-07-17 DIAGNOSIS — C155 Malignant neoplasm of lower third of esophagus: Secondary | ICD-10-CM | POA: Diagnosis not present

## 2020-07-17 DIAGNOSIS — I5021 Acute systolic (congestive) heart failure: Secondary | ICD-10-CM | POA: Diagnosis not present

## 2020-07-17 DIAGNOSIS — I480 Paroxysmal atrial fibrillation: Secondary | ICD-10-CM | POA: Diagnosis not present

## 2020-07-17 DIAGNOSIS — C159 Malignant neoplasm of esophagus, unspecified: Secondary | ICD-10-CM | POA: Diagnosis not present

## 2020-07-17 DIAGNOSIS — Z789 Other specified health status: Secondary | ICD-10-CM | POA: Diagnosis not present

## 2020-07-17 DIAGNOSIS — J9602 Acute respiratory failure with hypercapnia: Secondary | ICD-10-CM | POA: Diagnosis not present

## 2020-07-17 DIAGNOSIS — J9601 Acute respiratory failure with hypoxia: Secondary | ICD-10-CM | POA: Diagnosis not present

## 2020-07-17 DIAGNOSIS — Z9911 Dependence on respirator [ventilator] status: Secondary | ICD-10-CM | POA: Diagnosis not present

## 2020-07-17 DIAGNOSIS — R5381 Other malaise: Secondary | ICD-10-CM | POA: Diagnosis not present

## 2020-07-17 DIAGNOSIS — J90 Pleural effusion, not elsewhere classified: Secondary | ICD-10-CM | POA: Diagnosis not present

## 2020-07-18 DIAGNOSIS — G8918 Other acute postprocedural pain: Secondary | ICD-10-CM | POA: Diagnosis not present

## 2020-07-18 DIAGNOSIS — C159 Malignant neoplasm of esophagus, unspecified: Secondary | ICD-10-CM | POA: Diagnosis not present

## 2020-07-18 DIAGNOSIS — R918 Other nonspecific abnormal finding of lung field: Secondary | ICD-10-CM | POA: Diagnosis not present

## 2020-07-18 DIAGNOSIS — Z789 Other specified health status: Secondary | ICD-10-CM | POA: Diagnosis not present

## 2020-07-18 DIAGNOSIS — J90 Pleural effusion, not elsewhere classified: Secondary | ICD-10-CM | POA: Diagnosis not present

## 2020-07-18 DIAGNOSIS — Z9911 Dependence on respirator [ventilator] status: Secondary | ICD-10-CM | POA: Diagnosis not present

## 2020-07-18 DIAGNOSIS — C155 Malignant neoplasm of lower third of esophagus: Secondary | ICD-10-CM | POA: Diagnosis not present

## 2020-07-18 DIAGNOSIS — I480 Paroxysmal atrial fibrillation: Secondary | ICD-10-CM | POA: Diagnosis not present

## 2020-07-18 DIAGNOSIS — I5021 Acute systolic (congestive) heart failure: Secondary | ICD-10-CM | POA: Diagnosis not present

## 2020-07-18 DIAGNOSIS — J9602 Acute respiratory failure with hypercapnia: Secondary | ICD-10-CM | POA: Diagnosis not present

## 2020-07-18 DIAGNOSIS — J9601 Acute respiratory failure with hypoxia: Secondary | ICD-10-CM | POA: Diagnosis not present

## 2020-07-18 DIAGNOSIS — R5381 Other malaise: Secondary | ICD-10-CM | POA: Diagnosis not present

## 2020-07-19 DIAGNOSIS — G8918 Other acute postprocedural pain: Secondary | ICD-10-CM | POA: Diagnosis not present

## 2020-07-19 DIAGNOSIS — Z789 Other specified health status: Secondary | ICD-10-CM | POA: Diagnosis not present

## 2020-07-19 DIAGNOSIS — R918 Other nonspecific abnormal finding of lung field: Secondary | ICD-10-CM | POA: Diagnosis not present

## 2020-07-19 DIAGNOSIS — J9601 Acute respiratory failure with hypoxia: Secondary | ICD-10-CM | POA: Diagnosis not present

## 2020-07-19 DIAGNOSIS — J9602 Acute respiratory failure with hypercapnia: Secondary | ICD-10-CM | POA: Diagnosis not present

## 2020-07-19 DIAGNOSIS — R5381 Other malaise: Secondary | ICD-10-CM | POA: Diagnosis not present

## 2020-07-19 DIAGNOSIS — I480 Paroxysmal atrial fibrillation: Secondary | ICD-10-CM | POA: Diagnosis not present

## 2020-07-19 DIAGNOSIS — I5021 Acute systolic (congestive) heart failure: Secondary | ICD-10-CM | POA: Diagnosis not present

## 2020-07-19 DIAGNOSIS — C155 Malignant neoplasm of lower third of esophagus: Secondary | ICD-10-CM | POA: Diagnosis not present

## 2020-07-19 DIAGNOSIS — Z9911 Dependence on respirator [ventilator] status: Secondary | ICD-10-CM | POA: Diagnosis not present

## 2020-07-20 DIAGNOSIS — Z9911 Dependence on respirator [ventilator] status: Secondary | ICD-10-CM | POA: Diagnosis not present

## 2020-07-20 DIAGNOSIS — J948 Other specified pleural conditions: Secondary | ICD-10-CM | POA: Diagnosis not present

## 2020-07-20 DIAGNOSIS — D62 Acute posthemorrhagic anemia: Secondary | ICD-10-CM | POA: Diagnosis not present

## 2020-07-20 DIAGNOSIS — A419 Sepsis, unspecified organism: Secondary | ICD-10-CM | POA: Diagnosis not present

## 2020-07-20 DIAGNOSIS — J9621 Acute and chronic respiratory failure with hypoxia: Secondary | ICD-10-CM | POA: Diagnosis not present

## 2020-07-20 DIAGNOSIS — I739 Peripheral vascular disease, unspecified: Secondary | ICD-10-CM | POA: Diagnosis not present

## 2020-07-20 DIAGNOSIS — J9692 Respiratory failure, unspecified with hypercapnia: Secondary | ICD-10-CM | POA: Diagnosis not present

## 2020-07-20 DIAGNOSIS — J9622 Acute and chronic respiratory failure with hypercapnia: Secondary | ICD-10-CM | POA: Diagnosis not present

## 2020-07-20 DIAGNOSIS — J90 Pleural effusion, not elsewhere classified: Secondary | ICD-10-CM | POA: Diagnosis not present

## 2020-07-20 DIAGNOSIS — R6521 Severe sepsis with septic shock: Secondary | ICD-10-CM | POA: Diagnosis not present

## 2020-07-20 DIAGNOSIS — J9601 Acute respiratory failure with hypoxia: Secondary | ICD-10-CM | POA: Diagnosis not present

## 2020-07-20 DIAGNOSIS — C155 Malignant neoplasm of lower third of esophagus: Secondary | ICD-10-CM | POA: Diagnosis not present

## 2020-07-20 DIAGNOSIS — J939 Pneumothorax, unspecified: Secondary | ICD-10-CM | POA: Diagnosis not present

## 2020-07-21 DIAGNOSIS — J9692 Respiratory failure, unspecified with hypercapnia: Secondary | ICD-10-CM | POA: Diagnosis not present

## 2020-07-21 DIAGNOSIS — Z4682 Encounter for fitting and adjustment of non-vascular catheter: Secondary | ICD-10-CM | POA: Diagnosis not present

## 2020-07-21 DIAGNOSIS — J9621 Acute and chronic respiratory failure with hypoxia: Secondary | ICD-10-CM | POA: Diagnosis not present

## 2020-07-21 DIAGNOSIS — C155 Malignant neoplasm of lower third of esophagus: Secondary | ICD-10-CM | POA: Diagnosis not present

## 2020-07-21 DIAGNOSIS — Z9911 Dependence on respirator [ventilator] status: Secondary | ICD-10-CM | POA: Diagnosis not present

## 2020-07-21 DIAGNOSIS — R6521 Severe sepsis with septic shock: Secondary | ICD-10-CM | POA: Diagnosis not present

## 2020-07-21 DIAGNOSIS — A419 Sepsis, unspecified organism: Secondary | ICD-10-CM | POA: Diagnosis not present

## 2020-07-21 DIAGNOSIS — I739 Peripheral vascular disease, unspecified: Secondary | ICD-10-CM | POA: Diagnosis not present

## 2020-07-21 DIAGNOSIS — J9622 Acute and chronic respiratory failure with hypercapnia: Secondary | ICD-10-CM | POA: Diagnosis not present

## 2020-07-21 DIAGNOSIS — D62 Acute posthemorrhagic anemia: Secondary | ICD-10-CM | POA: Diagnosis not present

## 2020-07-21 DIAGNOSIS — R918 Other nonspecific abnormal finding of lung field: Secondary | ICD-10-CM | POA: Diagnosis not present

## 2020-07-21 DIAGNOSIS — J9601 Acute respiratory failure with hypoxia: Secondary | ICD-10-CM | POA: Diagnosis not present

## 2020-07-21 DIAGNOSIS — J948 Other specified pleural conditions: Secondary | ICD-10-CM | POA: Diagnosis not present

## 2020-07-22 DIAGNOSIS — R6521 Severe sepsis with septic shock: Secondary | ICD-10-CM | POA: Diagnosis not present

## 2020-07-22 DIAGNOSIS — J9601 Acute respiratory failure with hypoxia: Secondary | ICD-10-CM | POA: Diagnosis not present

## 2020-07-22 DIAGNOSIS — A419 Sepsis, unspecified organism: Secondary | ICD-10-CM | POA: Diagnosis not present

## 2020-07-22 DIAGNOSIS — J9692 Respiratory failure, unspecified with hypercapnia: Secondary | ICD-10-CM | POA: Diagnosis not present

## 2020-07-22 DIAGNOSIS — D62 Acute posthemorrhagic anemia: Secondary | ICD-10-CM | POA: Diagnosis not present

## 2020-07-22 DIAGNOSIS — Z9911 Dependence on respirator [ventilator] status: Secondary | ICD-10-CM | POA: Diagnosis not present

## 2020-07-22 DIAGNOSIS — I739 Peripheral vascular disease, unspecified: Secondary | ICD-10-CM | POA: Diagnosis not present

## 2020-07-22 DIAGNOSIS — C155 Malignant neoplasm of lower third of esophagus: Secondary | ICD-10-CM | POA: Diagnosis not present

## 2020-07-22 DIAGNOSIS — J939 Pneumothorax, unspecified: Secondary | ICD-10-CM | POA: Diagnosis not present

## 2020-07-22 DIAGNOSIS — J90 Pleural effusion, not elsewhere classified: Secondary | ICD-10-CM | POA: Diagnosis not present

## 2020-07-22 DIAGNOSIS — J9622 Acute and chronic respiratory failure with hypercapnia: Secondary | ICD-10-CM | POA: Diagnosis not present

## 2020-07-22 DIAGNOSIS — J9621 Acute and chronic respiratory failure with hypoxia: Secondary | ICD-10-CM | POA: Diagnosis not present

## 2020-07-23 DIAGNOSIS — E1165 Type 2 diabetes mellitus with hyperglycemia: Secondary | ICD-10-CM | POA: Diagnosis not present

## 2020-07-23 DIAGNOSIS — C155 Malignant neoplasm of lower third of esophagus: Secondary | ICD-10-CM | POA: Diagnosis not present

## 2020-07-23 DIAGNOSIS — J9600 Acute respiratory failure, unspecified whether with hypoxia or hypercapnia: Secondary | ICD-10-CM | POA: Diagnosis not present

## 2020-07-23 DIAGNOSIS — Z66 Do not resuscitate: Secondary | ICD-10-CM | POA: Diagnosis not present

## 2020-07-23 DIAGNOSIS — J948 Other specified pleural conditions: Secondary | ICD-10-CM | POA: Diagnosis not present

## 2020-07-23 DIAGNOSIS — Z794 Long term (current) use of insulin: Secondary | ICD-10-CM | POA: Diagnosis not present

## 2020-07-23 DIAGNOSIS — R509 Fever, unspecified: Secondary | ICD-10-CM | POA: Diagnosis not present

## 2020-07-23 DIAGNOSIS — A498 Other bacterial infections of unspecified site: Secondary | ICD-10-CM | POA: Diagnosis not present

## 2020-07-23 DIAGNOSIS — G9349 Other encephalopathy: Secondary | ICD-10-CM | POA: Diagnosis not present

## 2020-07-23 DIAGNOSIS — Z789 Other specified health status: Secondary | ICD-10-CM | POA: Diagnosis not present

## 2020-07-23 DIAGNOSIS — J90 Pleural effusion, not elsewhere classified: Secondary | ICD-10-CM | POA: Diagnosis not present

## 2020-07-23 DIAGNOSIS — N19 Unspecified kidney failure: Secondary | ICD-10-CM | POA: Diagnosis not present

## 2020-07-23 DIAGNOSIS — R4182 Altered mental status, unspecified: Secondary | ICD-10-CM | POA: Diagnosis not present

## 2020-07-23 DIAGNOSIS — R5081 Fever presenting with conditions classified elsewhere: Secondary | ICD-10-CM | POA: Diagnosis not present

## 2020-07-23 DIAGNOSIS — Z8701 Personal history of pneumonia (recurrent): Secondary | ICD-10-CM | POA: Diagnosis not present

## 2020-07-23 DIAGNOSIS — C159 Malignant neoplasm of esophagus, unspecified: Secondary | ICD-10-CM | POA: Diagnosis not present

## 2020-07-23 DIAGNOSIS — D72829 Elevated white blood cell count, unspecified: Secondary | ICD-10-CM | POA: Diagnosis not present

## 2020-07-23 DIAGNOSIS — K668 Other specified disorders of peritoneum: Secondary | ICD-10-CM | POA: Diagnosis not present

## 2020-07-23 DIAGNOSIS — Z9911 Dependence on respirator [ventilator] status: Secondary | ICD-10-CM | POA: Diagnosis not present

## 2020-07-23 DIAGNOSIS — I462 Cardiac arrest due to underlying cardiac condition: Secondary | ICD-10-CM | POA: Diagnosis not present

## 2020-07-24 DIAGNOSIS — Z8701 Personal history of pneumonia (recurrent): Secondary | ICD-10-CM | POA: Diagnosis not present

## 2020-07-24 DIAGNOSIS — Z66 Do not resuscitate: Secondary | ICD-10-CM | POA: Diagnosis not present

## 2020-07-24 DIAGNOSIS — I462 Cardiac arrest due to underlying cardiac condition: Secondary | ICD-10-CM | POA: Diagnosis not present

## 2020-07-24 DIAGNOSIS — D72829 Elevated white blood cell count, unspecified: Secondary | ICD-10-CM | POA: Diagnosis not present

## 2020-07-24 DIAGNOSIS — Z9911 Dependence on respirator [ventilator] status: Secondary | ICD-10-CM | POA: Diagnosis not present

## 2020-07-24 DIAGNOSIS — A498 Other bacterial infections of unspecified site: Secondary | ICD-10-CM | POA: Diagnosis not present

## 2020-07-24 DIAGNOSIS — C159 Malignant neoplasm of esophagus, unspecified: Secondary | ICD-10-CM | POA: Diagnosis not present

## 2020-07-24 DIAGNOSIS — N19 Unspecified kidney failure: Secondary | ICD-10-CM | POA: Diagnosis not present

## 2020-07-24 DIAGNOSIS — J9600 Acute respiratory failure, unspecified whether with hypoxia or hypercapnia: Secondary | ICD-10-CM | POA: Diagnosis not present

## 2020-07-24 DIAGNOSIS — R5081 Fever presenting with conditions classified elsewhere: Secondary | ICD-10-CM | POA: Diagnosis not present

## 2020-07-24 DIAGNOSIS — G9349 Other encephalopathy: Secondary | ICD-10-CM | POA: Diagnosis not present

## 2020-07-24 DIAGNOSIS — C155 Malignant neoplasm of lower third of esophagus: Secondary | ICD-10-CM | POA: Diagnosis not present

## 2020-07-25 DIAGNOSIS — C159 Malignant neoplasm of esophagus, unspecified: Secondary | ICD-10-CM | POA: Diagnosis not present

## 2020-07-25 DIAGNOSIS — G9349 Other encephalopathy: Secondary | ICD-10-CM | POA: Diagnosis not present

## 2020-07-25 DIAGNOSIS — Z4659 Encounter for fitting and adjustment of other gastrointestinal appliance and device: Secondary | ICD-10-CM | POA: Diagnosis not present

## 2020-07-25 DIAGNOSIS — I361 Nonrheumatic tricuspid (valve) insufficiency: Secondary | ICD-10-CM | POA: Diagnosis not present

## 2020-07-25 DIAGNOSIS — J9600 Acute respiratory failure, unspecified whether with hypoxia or hypercapnia: Secondary | ICD-10-CM | POA: Diagnosis not present

## 2020-07-25 DIAGNOSIS — C155 Malignant neoplasm of lower third of esophagus: Secondary | ICD-10-CM | POA: Diagnosis not present

## 2020-07-25 DIAGNOSIS — D72829 Elevated white blood cell count, unspecified: Secondary | ICD-10-CM | POA: Diagnosis not present

## 2020-07-25 DIAGNOSIS — R5081 Fever presenting with conditions classified elsewhere: Secondary | ICD-10-CM | POA: Diagnosis not present

## 2020-07-25 DIAGNOSIS — N19 Unspecified kidney failure: Secondary | ICD-10-CM | POA: Diagnosis not present

## 2020-07-25 DIAGNOSIS — Z9911 Dependence on respirator [ventilator] status: Secondary | ICD-10-CM | POA: Diagnosis not present

## 2020-07-25 DIAGNOSIS — Z8701 Personal history of pneumonia (recurrent): Secondary | ICD-10-CM | POA: Diagnosis not present

## 2020-07-25 DIAGNOSIS — I462 Cardiac arrest due to underlying cardiac condition: Secondary | ICD-10-CM | POA: Diagnosis not present

## 2020-07-25 DIAGNOSIS — Z66 Do not resuscitate: Secondary | ICD-10-CM | POA: Diagnosis not present

## 2020-07-25 DIAGNOSIS — A498 Other bacterial infections of unspecified site: Secondary | ICD-10-CM | POA: Diagnosis not present

## 2020-07-25 DIAGNOSIS — J90 Pleural effusion, not elsewhere classified: Secondary | ICD-10-CM | POA: Diagnosis not present

## 2020-07-25 DIAGNOSIS — I517 Cardiomegaly: Secondary | ICD-10-CM | POA: Diagnosis not present

## 2020-07-26 DIAGNOSIS — C155 Malignant neoplasm of lower third of esophagus: Secondary | ICD-10-CM | POA: Diagnosis not present

## 2020-07-26 DIAGNOSIS — Z9911 Dependence on respirator [ventilator] status: Secondary | ICD-10-CM | POA: Diagnosis not present

## 2020-07-26 DIAGNOSIS — R5081 Fever presenting with conditions classified elsewhere: Secondary | ICD-10-CM | POA: Diagnosis not present

## 2020-07-26 DIAGNOSIS — C159 Malignant neoplasm of esophagus, unspecified: Secondary | ICD-10-CM | POA: Diagnosis not present

## 2020-07-26 DIAGNOSIS — Z66 Do not resuscitate: Secondary | ICD-10-CM | POA: Diagnosis not present

## 2020-07-26 DIAGNOSIS — J90 Pleural effusion, not elsewhere classified: Secondary | ICD-10-CM | POA: Diagnosis not present

## 2020-07-26 DIAGNOSIS — J9600 Acute respiratory failure, unspecified whether with hypoxia or hypercapnia: Secondary | ICD-10-CM | POA: Diagnosis not present

## 2020-07-26 DIAGNOSIS — A498 Other bacterial infections of unspecified site: Secondary | ICD-10-CM | POA: Diagnosis not present

## 2020-07-26 DIAGNOSIS — Z8701 Personal history of pneumonia (recurrent): Secondary | ICD-10-CM | POA: Diagnosis not present

## 2020-07-26 DIAGNOSIS — N19 Unspecified kidney failure: Secondary | ICD-10-CM | POA: Diagnosis not present

## 2020-07-26 DIAGNOSIS — I462 Cardiac arrest due to underlying cardiac condition: Secondary | ICD-10-CM | POA: Diagnosis not present

## 2020-07-26 DIAGNOSIS — G9349 Other encephalopathy: Secondary | ICD-10-CM | POA: Diagnosis not present

## 2020-07-26 DIAGNOSIS — J9601 Acute respiratory failure with hypoxia: Secondary | ICD-10-CM | POA: Diagnosis not present

## 2020-07-26 DIAGNOSIS — D72829 Elevated white blood cell count, unspecified: Secondary | ICD-10-CM | POA: Diagnosis not present

## 2020-07-26 DIAGNOSIS — Z4682 Encounter for fitting and adjustment of non-vascular catheter: Secondary | ICD-10-CM | POA: Diagnosis not present

## 2020-07-27 DIAGNOSIS — D72829 Elevated white blood cell count, unspecified: Secondary | ICD-10-CM | POA: Diagnosis not present

## 2020-07-27 DIAGNOSIS — Z789 Other specified health status: Secondary | ICD-10-CM | POA: Diagnosis not present

## 2020-07-27 DIAGNOSIS — Z66 Do not resuscitate: Secondary | ICD-10-CM | POA: Diagnosis not present

## 2020-07-27 DIAGNOSIS — A498 Other bacterial infections of unspecified site: Secondary | ICD-10-CM | POA: Diagnosis not present

## 2020-07-27 DIAGNOSIS — G9349 Other encephalopathy: Secondary | ICD-10-CM | POA: Diagnosis not present

## 2020-07-27 DIAGNOSIS — N19 Unspecified kidney failure: Secondary | ICD-10-CM | POA: Diagnosis not present

## 2020-07-27 DIAGNOSIS — I462 Cardiac arrest due to underlying cardiac condition: Secondary | ICD-10-CM | POA: Diagnosis not present

## 2020-07-27 DIAGNOSIS — C155 Malignant neoplasm of lower third of esophagus: Secondary | ICD-10-CM | POA: Diagnosis not present

## 2020-07-27 DIAGNOSIS — J9600 Acute respiratory failure, unspecified whether with hypoxia or hypercapnia: Secondary | ICD-10-CM | POA: Diagnosis not present

## 2020-07-27 DIAGNOSIS — Z794 Long term (current) use of insulin: Secondary | ICD-10-CM | POA: Diagnosis not present

## 2020-07-27 DIAGNOSIS — J90 Pleural effusion, not elsewhere classified: Secondary | ICD-10-CM | POA: Diagnosis not present

## 2020-07-27 DIAGNOSIS — E1165 Type 2 diabetes mellitus with hyperglycemia: Secondary | ICD-10-CM | POA: Diagnosis not present

## 2020-07-27 DIAGNOSIS — R5081 Fever presenting with conditions classified elsewhere: Secondary | ICD-10-CM | POA: Diagnosis not present

## 2020-07-27 DIAGNOSIS — C159 Malignant neoplasm of esophagus, unspecified: Secondary | ICD-10-CM | POA: Diagnosis not present

## 2020-07-27 DIAGNOSIS — Z4682 Encounter for fitting and adjustment of non-vascular catheter: Secondary | ICD-10-CM | POA: Diagnosis not present

## 2020-07-27 DIAGNOSIS — Z9911 Dependence on respirator [ventilator] status: Secondary | ICD-10-CM | POA: Diagnosis not present

## 2020-07-27 DIAGNOSIS — Z8701 Personal history of pneumonia (recurrent): Secondary | ICD-10-CM | POA: Diagnosis not present

## 2020-07-27 DIAGNOSIS — J9601 Acute respiratory failure with hypoxia: Secondary | ICD-10-CM | POA: Diagnosis not present

## 2020-07-28 DIAGNOSIS — N19 Unspecified kidney failure: Secondary | ICD-10-CM | POA: Diagnosis not present

## 2020-07-28 DIAGNOSIS — J9600 Acute respiratory failure, unspecified whether with hypoxia or hypercapnia: Secondary | ICD-10-CM | POA: Diagnosis not present

## 2020-07-28 DIAGNOSIS — G9349 Other encephalopathy: Secondary | ICD-10-CM | POA: Diagnosis not present

## 2020-07-28 DIAGNOSIS — Z66 Do not resuscitate: Secondary | ICD-10-CM | POA: Diagnosis not present

## 2020-07-28 DIAGNOSIS — C155 Malignant neoplasm of lower third of esophagus: Secondary | ICD-10-CM | POA: Diagnosis not present

## 2020-07-28 DIAGNOSIS — J90 Pleural effusion, not elsewhere classified: Secondary | ICD-10-CM | POA: Diagnosis not present

## 2020-07-28 DIAGNOSIS — A498 Other bacterial infections of unspecified site: Secondary | ICD-10-CM | POA: Diagnosis not present

## 2020-07-29 DIAGNOSIS — N19 Unspecified kidney failure: Secondary | ICD-10-CM | POA: Diagnosis not present

## 2020-07-29 DIAGNOSIS — J90 Pleural effusion, not elsewhere classified: Secondary | ICD-10-CM | POA: Diagnosis not present

## 2020-07-29 DIAGNOSIS — C155 Malignant neoplasm of lower third of esophagus: Secondary | ICD-10-CM | POA: Diagnosis not present

## 2020-07-29 DIAGNOSIS — Z4682 Encounter for fitting and adjustment of non-vascular catheter: Secondary | ICD-10-CM | POA: Diagnosis not present

## 2020-07-29 DIAGNOSIS — J9600 Acute respiratory failure, unspecified whether with hypoxia or hypercapnia: Secondary | ICD-10-CM | POA: Diagnosis not present

## 2020-07-29 DIAGNOSIS — Z66 Do not resuscitate: Secondary | ICD-10-CM | POA: Diagnosis not present

## 2020-07-29 DIAGNOSIS — A498 Other bacterial infections of unspecified site: Secondary | ICD-10-CM | POA: Diagnosis not present

## 2020-07-29 DIAGNOSIS — G9349 Other encephalopathy: Secondary | ICD-10-CM | POA: Diagnosis not present

## 2020-07-30 DIAGNOSIS — I10 Essential (primary) hypertension: Secondary | ICD-10-CM | POA: Diagnosis not present

## 2020-07-30 DIAGNOSIS — K219 Gastro-esophageal reflux disease without esophagitis: Secondary | ICD-10-CM | POA: Diagnosis not present

## 2020-07-30 DIAGNOSIS — D500 Iron deficiency anemia secondary to blood loss (chronic): Secondary | ICD-10-CM | POA: Diagnosis not present

## 2020-07-30 DIAGNOSIS — I468 Cardiac arrest due to other underlying condition: Secondary | ICD-10-CM | POA: Diagnosis not present

## 2020-07-30 DIAGNOSIS — R918 Other nonspecific abnormal finding of lung field: Secondary | ICD-10-CM | POA: Diagnosis not present

## 2020-07-30 DIAGNOSIS — M5416 Radiculopathy, lumbar region: Secondary | ICD-10-CM | POA: Diagnosis not present

## 2020-07-30 DIAGNOSIS — C159 Malignant neoplasm of esophagus, unspecified: Secondary | ICD-10-CM | POA: Diagnosis not present

## 2020-07-30 DIAGNOSIS — J90 Pleural effusion, not elsewhere classified: Secondary | ICD-10-CM | POA: Diagnosis not present

## 2020-07-30 DIAGNOSIS — Z4682 Encounter for fitting and adjustment of non-vascular catheter: Secondary | ICD-10-CM | POA: Diagnosis not present

## 2020-07-30 DIAGNOSIS — C155 Malignant neoplasm of lower third of esophagus: Secondary | ICD-10-CM | POA: Diagnosis not present

## 2020-07-30 DIAGNOSIS — I2510 Atherosclerotic heart disease of native coronary artery without angina pectoris: Secondary | ICD-10-CM | POA: Diagnosis not present

## 2020-07-30 DIAGNOSIS — J95821 Acute postprocedural respiratory failure: Secondary | ICD-10-CM | POA: Diagnosis not present

## 2020-07-30 DIAGNOSIS — J449 Chronic obstructive pulmonary disease, unspecified: Secondary | ICD-10-CM | POA: Diagnosis not present

## 2020-07-31 DIAGNOSIS — I10 Essential (primary) hypertension: Secondary | ICD-10-CM | POA: Diagnosis not present

## 2020-07-31 DIAGNOSIS — C159 Malignant neoplasm of esophagus, unspecified: Secondary | ICD-10-CM | POA: Diagnosis not present

## 2020-07-31 DIAGNOSIS — I468 Cardiac arrest due to other underlying condition: Secondary | ICD-10-CM | POA: Diagnosis not present

## 2020-07-31 DIAGNOSIS — J449 Chronic obstructive pulmonary disease, unspecified: Secondary | ICD-10-CM | POA: Diagnosis not present

## 2020-07-31 DIAGNOSIS — I2510 Atherosclerotic heart disease of native coronary artery without angina pectoris: Secondary | ICD-10-CM | POA: Diagnosis not present

## 2020-07-31 DIAGNOSIS — M5416 Radiculopathy, lumbar region: Secondary | ICD-10-CM | POA: Diagnosis not present

## 2020-07-31 DIAGNOSIS — R918 Other nonspecific abnormal finding of lung field: Secondary | ICD-10-CM | POA: Diagnosis not present

## 2020-07-31 DIAGNOSIS — Z9911 Dependence on respirator [ventilator] status: Secondary | ICD-10-CM | POA: Diagnosis not present

## 2020-07-31 DIAGNOSIS — J95821 Acute postprocedural respiratory failure: Secondary | ICD-10-CM | POA: Diagnosis not present

## 2020-07-31 DIAGNOSIS — K219 Gastro-esophageal reflux disease without esophagitis: Secondary | ICD-10-CM | POA: Diagnosis not present

## 2020-07-31 DIAGNOSIS — J90 Pleural effusion, not elsewhere classified: Secondary | ICD-10-CM | POA: Diagnosis not present

## 2020-07-31 DIAGNOSIS — D500 Iron deficiency anemia secondary to blood loss (chronic): Secondary | ICD-10-CM | POA: Diagnosis not present

## 2020-07-31 DIAGNOSIS — Z4682 Encounter for fitting and adjustment of non-vascular catheter: Secondary | ICD-10-CM | POA: Diagnosis not present

## 2020-08-01 DIAGNOSIS — R918 Other nonspecific abnormal finding of lung field: Secondary | ICD-10-CM | POA: Diagnosis not present

## 2020-08-01 DIAGNOSIS — G9349 Other encephalopathy: Secondary | ICD-10-CM | POA: Diagnosis not present

## 2020-08-01 DIAGNOSIS — C159 Malignant neoplasm of esophagus, unspecified: Secondary | ICD-10-CM | POA: Diagnosis not present

## 2020-08-01 DIAGNOSIS — D62 Acute posthemorrhagic anemia: Secondary | ICD-10-CM | POA: Diagnosis not present

## 2020-08-01 DIAGNOSIS — E870 Hyperosmolality and hypernatremia: Secondary | ICD-10-CM | POA: Diagnosis not present

## 2020-08-01 DIAGNOSIS — D689 Coagulation defect, unspecified: Secondary | ICD-10-CM | POA: Diagnosis not present

## 2020-08-01 DIAGNOSIS — J151 Pneumonia due to Pseudomonas: Secondary | ICD-10-CM | POA: Diagnosis not present

## 2020-08-01 DIAGNOSIS — R5081 Fever presenting with conditions classified elsewhere: Secondary | ICD-10-CM | POA: Diagnosis not present

## 2020-08-01 DIAGNOSIS — A498 Other bacterial infections of unspecified site: Secondary | ICD-10-CM | POA: Diagnosis not present

## 2020-08-01 DIAGNOSIS — R1310 Dysphagia, unspecified: Secondary | ICD-10-CM | POA: Diagnosis not present

## 2020-08-01 DIAGNOSIS — D72829 Elevated white blood cell count, unspecified: Secondary | ICD-10-CM | POA: Diagnosis not present

## 2020-08-01 DIAGNOSIS — J9600 Acute respiratory failure, unspecified whether with hypoxia or hypercapnia: Secondary | ICD-10-CM | POA: Diagnosis not present

## 2020-08-01 DIAGNOSIS — J95821 Acute postprocedural respiratory failure: Secondary | ICD-10-CM | POA: Diagnosis not present

## 2020-08-01 DIAGNOSIS — D899 Disorder involving the immune mechanism, unspecified: Secondary | ICD-10-CM | POA: Diagnosis not present

## 2020-08-01 DIAGNOSIS — I462 Cardiac arrest due to underlying cardiac condition: Secondary | ICD-10-CM | POA: Diagnosis not present

## 2020-08-01 DIAGNOSIS — Z8701 Personal history of pneumonia (recurrent): Secondary | ICD-10-CM | POA: Diagnosis not present

## 2020-08-01 DIAGNOSIS — Z9911 Dependence on respirator [ventilator] status: Secondary | ICD-10-CM | POA: Diagnosis not present

## 2020-08-02 DIAGNOSIS — D72829 Elevated white blood cell count, unspecified: Secondary | ICD-10-CM | POA: Diagnosis not present

## 2020-08-02 DIAGNOSIS — C159 Malignant neoplasm of esophagus, unspecified: Secondary | ICD-10-CM | POA: Diagnosis not present

## 2020-08-02 DIAGNOSIS — Z9911 Dependence on respirator [ventilator] status: Secondary | ICD-10-CM | POA: Diagnosis not present

## 2020-08-02 DIAGNOSIS — I462 Cardiac arrest due to underlying cardiac condition: Secondary | ICD-10-CM | POA: Diagnosis not present

## 2020-08-02 DIAGNOSIS — J95821 Acute postprocedural respiratory failure: Secondary | ICD-10-CM | POA: Diagnosis not present

## 2020-08-02 DIAGNOSIS — E870 Hyperosmolality and hypernatremia: Secondary | ICD-10-CM | POA: Diagnosis not present

## 2020-08-02 DIAGNOSIS — J151 Pneumonia due to Pseudomonas: Secondary | ICD-10-CM | POA: Diagnosis not present

## 2020-08-02 DIAGNOSIS — J90 Pleural effusion, not elsewhere classified: Secondary | ICD-10-CM | POA: Diagnosis not present

## 2020-08-02 DIAGNOSIS — J9811 Atelectasis: Secondary | ICD-10-CM | POA: Diagnosis not present

## 2020-08-02 DIAGNOSIS — K5900 Constipation, unspecified: Secondary | ICD-10-CM | POA: Diagnosis not present

## 2020-08-02 DIAGNOSIS — A498 Other bacterial infections of unspecified site: Secondary | ICD-10-CM | POA: Diagnosis not present

## 2020-08-02 DIAGNOSIS — J9600 Acute respiratory failure, unspecified whether with hypoxia or hypercapnia: Secondary | ICD-10-CM | POA: Diagnosis not present

## 2020-08-02 DIAGNOSIS — D899 Disorder involving the immune mechanism, unspecified: Secondary | ICD-10-CM | POA: Diagnosis not present

## 2020-08-02 DIAGNOSIS — D62 Acute posthemorrhagic anemia: Secondary | ICD-10-CM | POA: Diagnosis not present

## 2020-08-02 DIAGNOSIS — R1310 Dysphagia, unspecified: Secondary | ICD-10-CM | POA: Diagnosis not present

## 2020-08-02 DIAGNOSIS — R5081 Fever presenting with conditions classified elsewhere: Secondary | ICD-10-CM | POA: Diagnosis not present

## 2020-08-02 DIAGNOSIS — J811 Chronic pulmonary edema: Secondary | ICD-10-CM | POA: Diagnosis not present

## 2020-08-02 DIAGNOSIS — G9349 Other encephalopathy: Secondary | ICD-10-CM | POA: Diagnosis not present

## 2020-08-02 DIAGNOSIS — D689 Coagulation defect, unspecified: Secondary | ICD-10-CM | POA: Diagnosis not present

## 2020-08-02 DIAGNOSIS — R918 Other nonspecific abnormal finding of lung field: Secondary | ICD-10-CM | POA: Diagnosis not present

## 2020-08-02 DIAGNOSIS — Z8701 Personal history of pneumonia (recurrent): Secondary | ICD-10-CM | POA: Diagnosis not present

## 2020-08-03 DIAGNOSIS — D62 Acute posthemorrhagic anemia: Secondary | ICD-10-CM | POA: Diagnosis not present

## 2020-08-03 DIAGNOSIS — R918 Other nonspecific abnormal finding of lung field: Secondary | ICD-10-CM | POA: Diagnosis not present

## 2020-08-03 DIAGNOSIS — D899 Disorder involving the immune mechanism, unspecified: Secondary | ICD-10-CM | POA: Diagnosis not present

## 2020-08-03 DIAGNOSIS — D689 Coagulation defect, unspecified: Secondary | ICD-10-CM | POA: Diagnosis not present

## 2020-08-03 DIAGNOSIS — D72829 Elevated white blood cell count, unspecified: Secondary | ICD-10-CM | POA: Diagnosis not present

## 2020-08-03 DIAGNOSIS — K5900 Constipation, unspecified: Secondary | ICD-10-CM | POA: Diagnosis not present

## 2020-08-03 DIAGNOSIS — J151 Pneumonia due to Pseudomonas: Secondary | ICD-10-CM | POA: Diagnosis not present

## 2020-08-03 DIAGNOSIS — J95821 Acute postprocedural respiratory failure: Secondary | ICD-10-CM | POA: Diagnosis not present

## 2020-08-03 DIAGNOSIS — R1310 Dysphagia, unspecified: Secondary | ICD-10-CM | POA: Diagnosis not present

## 2020-08-03 DIAGNOSIS — J90 Pleural effusion, not elsewhere classified: Secondary | ICD-10-CM | POA: Diagnosis not present

## 2020-08-04 DIAGNOSIS — E870 Hyperosmolality and hypernatremia: Secondary | ICD-10-CM | POA: Diagnosis not present

## 2020-08-04 DIAGNOSIS — J90 Pleural effusion, not elsewhere classified: Secondary | ICD-10-CM | POA: Diagnosis not present

## 2020-08-04 DIAGNOSIS — R1310 Dysphagia, unspecified: Secondary | ICD-10-CM | POA: Diagnosis not present

## 2020-08-04 DIAGNOSIS — A498 Other bacterial infections of unspecified site: Secondary | ICD-10-CM | POA: Diagnosis not present

## 2020-08-04 DIAGNOSIS — Z9911 Dependence on respirator [ventilator] status: Secondary | ICD-10-CM | POA: Diagnosis not present

## 2020-08-04 DIAGNOSIS — R5081 Fever presenting with conditions classified elsewhere: Secondary | ICD-10-CM | POA: Diagnosis not present

## 2020-08-04 DIAGNOSIS — J95821 Acute postprocedural respiratory failure: Secondary | ICD-10-CM | POA: Diagnosis not present

## 2020-08-04 DIAGNOSIS — J151 Pneumonia due to Pseudomonas: Secondary | ICD-10-CM | POA: Diagnosis not present

## 2020-08-04 DIAGNOSIS — D62 Acute posthemorrhagic anemia: Secondary | ICD-10-CM | POA: Diagnosis not present

## 2020-08-04 DIAGNOSIS — G9349 Other encephalopathy: Secondary | ICD-10-CM | POA: Diagnosis not present

## 2020-08-04 DIAGNOSIS — C159 Malignant neoplasm of esophagus, unspecified: Secondary | ICD-10-CM | POA: Diagnosis not present

## 2020-08-04 DIAGNOSIS — D689 Coagulation defect, unspecified: Secondary | ICD-10-CM | POA: Diagnosis not present

## 2020-08-04 DIAGNOSIS — D72829 Elevated white blood cell count, unspecified: Secondary | ICD-10-CM | POA: Diagnosis not present

## 2020-08-04 DIAGNOSIS — D899 Disorder involving the immune mechanism, unspecified: Secondary | ICD-10-CM | POA: Diagnosis not present

## 2020-08-04 DIAGNOSIS — Z8701 Personal history of pneumonia (recurrent): Secondary | ICD-10-CM | POA: Diagnosis not present

## 2020-08-04 DIAGNOSIS — J9600 Acute respiratory failure, unspecified whether with hypoxia or hypercapnia: Secondary | ICD-10-CM | POA: Diagnosis not present

## 2020-08-04 DIAGNOSIS — I462 Cardiac arrest due to underlying cardiac condition: Secondary | ICD-10-CM | POA: Diagnosis not present

## 2020-08-04 DIAGNOSIS — R918 Other nonspecific abnormal finding of lung field: Secondary | ICD-10-CM | POA: Diagnosis not present

## 2020-08-05 DIAGNOSIS — J90 Pleural effusion, not elsewhere classified: Secondary | ICD-10-CM | POA: Diagnosis not present

## 2020-08-06 DIAGNOSIS — D72829 Elevated white blood cell count, unspecified: Secondary | ICD-10-CM | POA: Diagnosis not present

## 2020-08-06 DIAGNOSIS — I2693 Single subsegmental pulmonary embolism without acute cor pulmonale: Secondary | ICD-10-CM | POA: Diagnosis not present

## 2020-08-06 DIAGNOSIS — D899 Disorder involving the immune mechanism, unspecified: Secondary | ICD-10-CM | POA: Diagnosis not present

## 2020-08-06 DIAGNOSIS — R918 Other nonspecific abnormal finding of lung field: Secondary | ICD-10-CM | POA: Diagnosis not present

## 2020-08-06 DIAGNOSIS — J151 Pneumonia due to Pseudomonas: Secondary | ICD-10-CM | POA: Diagnosis not present

## 2020-08-06 DIAGNOSIS — D689 Coagulation defect, unspecified: Secondary | ICD-10-CM | POA: Diagnosis not present

## 2020-08-06 DIAGNOSIS — D62 Acute posthemorrhagic anemia: Secondary | ICD-10-CM | POA: Diagnosis not present

## 2020-08-06 DIAGNOSIS — R1310 Dysphagia, unspecified: Secondary | ICD-10-CM | POA: Diagnosis not present

## 2020-08-06 DIAGNOSIS — J95821 Acute postprocedural respiratory failure: Secondary | ICD-10-CM | POA: Diagnosis not present

## 2020-08-07 DIAGNOSIS — Z794 Long term (current) use of insulin: Secondary | ICD-10-CM | POA: Diagnosis not present

## 2020-08-07 DIAGNOSIS — E1165 Type 2 diabetes mellitus with hyperglycemia: Secondary | ICD-10-CM | POA: Diagnosis not present

## 2020-08-16 ENCOUNTER — Other Ambulatory Visit: Payer: Medicare HMO

## 2020-08-16 ENCOUNTER — Ambulatory Visit: Payer: Medicare HMO | Admitting: Hematology and Oncology

## 2020-08-29 DEATH — deceased
